# Patient Record
Sex: Female | Born: 1975 | Race: White | Hispanic: No | Marital: Married | Smoking: Never smoker
Health system: Southern US, Community
[De-identification: ages and names within clinical notes are randomized; demographics above are authoritative.]

## PROBLEM LIST (undated history)

## (undated) DIAGNOSIS — F32A Depression, unspecified: Secondary | ICD-10-CM

## (undated) DIAGNOSIS — F329 Major depressive disorder, single episode, unspecified: Secondary | ICD-10-CM

## (undated) DIAGNOSIS — E079 Disorder of thyroid, unspecified: Secondary | ICD-10-CM

## (undated) DIAGNOSIS — N809 Endometriosis, unspecified: Secondary | ICD-10-CM

## (undated) HISTORY — PX: LAPAROSCOPY: SHX197

## (undated) HISTORY — DX: Major depressive disorder, single episode, unspecified: F32.9

## (undated) HISTORY — PX: TOTAL ABDOMINAL HYSTERECTOMY: SHX209

## (undated) HISTORY — PX: GALLBLADDER SURGERY: SHX652

## (undated) HISTORY — DX: Depression, unspecified: F32.A

## (undated) HISTORY — DX: Endometriosis, unspecified: N80.9

## (undated) HISTORY — DX: Disorder of thyroid, unspecified: E07.9

## (undated) HISTORY — PX: ABDOMINAL HYSTERECTOMY: SHX81

---

## 2006-10-11 ENCOUNTER — Emergency Department: Payer: Self-pay | Admitting: Unknown Physician Specialty

## 2007-01-31 ENCOUNTER — Ambulatory Visit: Payer: Self-pay | Admitting: Obstetrics and Gynecology

## 2007-02-03 ENCOUNTER — Ambulatory Visit: Payer: Self-pay | Admitting: Obstetrics and Gynecology

## 2009-06-30 ENCOUNTER — Ambulatory Visit: Payer: Self-pay | Admitting: Obstetrics & Gynecology

## 2013-06-18 ENCOUNTER — Encounter: Payer: Self-pay | Admitting: Maternal & Fetal Medicine

## 2013-06-25 ENCOUNTER — Ambulatory Visit: Payer: Self-pay | Admitting: Obstetrics and Gynecology

## 2013-06-25 LAB — COMPREHENSIVE METABOLIC PANEL
AST: 28 U/L (ref 15–37)
Albumin: 3.5 g/dL (ref 3.4–5.0)
Alkaline Phosphatase: 43 U/L — ABNORMAL LOW
Anion Gap: 5 — ABNORMAL LOW (ref 7–16)
BUN: 10 mg/dL (ref 7–18)
Bilirubin,Total: 0.2 mg/dL (ref 0.2–1.0)
Calcium, Total: 8.2 mg/dL — ABNORMAL LOW (ref 8.5–10.1)
Chloride: 109 mmol/L — ABNORMAL HIGH (ref 98–107)
Co2: 27 mmol/L (ref 21–32)
Creatinine: 0.63 mg/dL (ref 0.60–1.30)
EGFR (African American): >60
EGFR (Non-African Amer.): >60
GLUCOSE: 127 mg/dL — AB (ref 65–99)
OSMOLALITY: 282 (ref 275–301)
Potassium: 3.7 mmol/L (ref 3.5–5.1)
SGPT (ALT): 16 U/L (ref 12–78)
Sodium: 141 mmol/L (ref 136–145)
Total Protein: 7 g/dL (ref 6.4–8.2)

## 2013-06-25 LAB — URINALYSIS, COMPLETE
BACTERIA: NONE SEEN
Bilirubin,UR: NEGATIVE
GLUCOSE, UR: NEGATIVE mg/dL (ref 0–75)
Ketone: NEGATIVE
NITRITE: NEGATIVE
Ph: 7 (ref 4.5–8.0)
Protein: NEGATIVE
RBC,UR: 1484 /HPF (ref 0–5)
Specific Gravity: 1.013 (ref 1.003–1.030)
WBC UR: 4 /HPF (ref 0–5)

## 2013-06-25 LAB — CBC
HCT: 39.8 % (ref 35.0–47.0)
HGB: 13.2 g/dL (ref 12.0–16.0)
MCH: 31.6 pg (ref 26.0–34.0)
MCHC: 33.2 g/dL (ref 32.0–36.0)
MCV: 95 fL (ref 80–100)
PLATELETS: 184 10*3/uL (ref 150–440)
RBC: 4.18 10*6/uL (ref 3.80–5.20)
RDW: 12.6 % (ref 11.5–14.5)
WBC: 7.8 10*3/uL (ref 3.6–11.0)

## 2013-06-25 LAB — LIPASE, BLOOD: Lipase: 139 U/L (ref 73–393)

## 2013-06-25 LAB — HCG, QUANTITATIVE, PREGNANCY: BETA HCG, QUANT.: 16016 m[IU]/mL — AB

## 2013-06-27 LAB — PATHOLOGY REPORT

## 2013-08-06 LAB — CBC WITH DIFFERENTIAL/PLATELET
BASOS ABS: 0.1 10*3/uL (ref 0.0–0.1)
Basophil %: 0.4 %
EOS ABS: 0 10*3/uL (ref 0.0–0.7)
Eosinophil %: 0.1 %
HCT: 42.5 % (ref 35.0–47.0)
HGB: 13.7 g/dL (ref 12.0–16.0)
LYMPHS ABS: 1 10*3/uL (ref 1.0–3.6)
Lymphocyte %: 6.3 %
MCH: 30.2 pg (ref 26.0–34.0)
MCHC: 32.3 g/dL (ref 32.0–36.0)
MCV: 94 fL (ref 80–100)
MONO ABS: 0.5 x10 3/mm (ref 0.2–0.9)
MONOS PCT: 3.4 %
Neutrophil #: 13.9 10*3/uL — ABNORMAL HIGH (ref 1.4–6.5)
Neutrophil %: 89.8 %
PLATELETS: 260 10*3/uL (ref 150–440)
RBC: 4.54 10*6/uL (ref 3.80–5.20)
RDW: 12.8 % (ref 11.5–14.5)
WBC: 15.5 10*3/uL — AB (ref 3.6–11.0)

## 2013-08-06 LAB — COMPREHENSIVE METABOLIC PANEL
ALBUMIN: 4.3 g/dL (ref 3.4–5.0)
ALK PHOS: 60 U/L
Anion Gap: 6 — ABNORMAL LOW (ref 7–16)
BUN: 11 mg/dL (ref 7–18)
Bilirubin,Total: 0.7 mg/dL (ref 0.2–1.0)
CALCIUM: 9.4 mg/dL (ref 8.5–10.1)
Chloride: 104 mmol/L (ref 98–107)
Co2: 26 mmol/L (ref 21–32)
Creatinine: 0.66 mg/dL (ref 0.60–1.30)
EGFR (African American): >60
Glucose: 106 mg/dL — ABNORMAL HIGH (ref 65–99)
OSMOLALITY: 272 (ref 275–301)
Potassium: 4.5 mmol/L (ref 3.5–5.1)
SGOT(AST): 41 U/L — ABNORMAL HIGH (ref 15–37)
SGPT (ALT): 19 U/L (ref 12–78)
SODIUM: 136 mmol/L (ref 136–145)
Total Protein: 8.4 g/dL — ABNORMAL HIGH (ref 6.4–8.2)

## 2013-08-06 LAB — URINALYSIS, COMPLETE
BILIRUBIN, UR: NEGATIVE
BLOOD: NEGATIVE
Bacteria: NONE SEEN
Glucose,UR: NEGATIVE mg/dL (ref 0–75)
Nitrite: NEGATIVE
PH: 6 (ref 4.5–8.0)
Protein: NEGATIVE
Specific Gravity: 1.027 (ref 1.003–1.030)
Squamous Epithelial: 3
WBC UR: 5 /HPF (ref 0–5)

## 2013-08-06 LAB — LIPASE, BLOOD: LIPASE: 146 U/L (ref 73–393)

## 2013-08-07 ENCOUNTER — Inpatient Hospital Stay: Payer: Self-pay | Admitting: Surgery

## 2013-08-07 LAB — COMPREHENSIVE METABOLIC PANEL
ALK PHOS: 52 U/L
Albumin: 3.5 g/dL (ref 3.4–5.0)
Anion Gap: 3 — ABNORMAL LOW (ref 7–16)
BUN: 8 mg/dL (ref 7–18)
Bilirubin,Total: 0.8 mg/dL (ref 0.2–1.0)
Calcium, Total: 8.9 mg/dL (ref 8.5–10.1)
Chloride: 105 mmol/L (ref 98–107)
Co2: 29 mmol/L (ref 21–32)
Creatinine: 0.8 mg/dL (ref 0.60–1.30)
EGFR (African American): >60
GLUCOSE: 94 mg/dL (ref 65–99)
OSMOLALITY: 272 (ref 275–301)
Potassium: 4 mmol/L (ref 3.5–5.1)
SGOT(AST): 24 U/L (ref 15–37)
SGPT (ALT): 15 U/L (ref 12–78)
SODIUM: 137 mmol/L (ref 136–145)
Total Protein: 7.2 g/dL (ref 6.4–8.2)

## 2013-08-09 LAB — PATHOLOGY REPORT

## 2013-12-31 ENCOUNTER — Ambulatory Visit: Payer: BC Managed Care – PPO | Admitting: Sports Medicine

## 2013-12-31 ENCOUNTER — Encounter: Payer: Self-pay | Admitting: Sports Medicine

## 2013-12-31 ENCOUNTER — Ambulatory Visit (INDEPENDENT_AMBULATORY_CARE_PROVIDER_SITE_OTHER): Payer: BC Managed Care – PPO | Admitting: Sports Medicine

## 2013-12-31 VITALS — BP 121/52 | Ht 66.5 in | Wt 162.0 lb

## 2013-12-31 DIAGNOSIS — M2241 Chondromalacia patellae, right knee: Secondary | ICD-10-CM | POA: Diagnosis not present

## 2013-12-31 NOTE — Progress Notes (Addendum)
   Subjective:    Patient ID: Emily Sawyer, female    DOB: 22-Apr-1975, 38 y.o.   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 Pain began acutely when she was reclining in a chair. While reclining she felt a pop in her knee and since then has had discomfort particularly with leg extensions or squatting. Her pain is in the anterior lateral knee. No swelling. No mechanical symptoms. She was seen at the [facility] in [place] and x-rays were obtained. They are unavailable for my review but she was told that she has a bone spur. Referral was made to our office for further evaluation and treatment. Patient denies pain with running. She has a 13 mile obstacle course race in 2 weeks and is in the process of training for a full marathon in 16 weeks. She denies any significant problems with her knees in the past but did have some knee pain when playing basketball in high school.no prior knee surgery. No hip pain.  Past medical history reviewed Medications reviewed Allergies reviewed Social history reviewed    Review of Systems     Objective:   Physical Exam Well-developed, well-nourished. No acute distress. Awake alert and oriented 3. Vital signs reviewed.  Right knee: Full range of motion. No effusion. No soft tissue swelling. There is quivering of the quadriceps when lowering the right lower leg against gravity. VMO weakness. Pretty good hip abductor strength. 2-3+ patellofemoral crepitus. There is tethering of the patella laterally but a negative patellar compression test. No tenderness to palpation along the course of the patellar tendon. No tenderness along the distal IT band. No joint line tenderness. Negative McMurray's. Good joint stability. Neurovascularly intact distally. Slight pes planus with standing.  Patient has some mild dynamic genu valgus with running and slight pronation. Running  without a limp.       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 She is okay to continue running but she will avoid those activities that cause her pain especially squats, lunges, or leg press. Follow-up in 4 weeks. She will bring her x-rays with her to her follow-up visit. Although she does have some mild pronation with running I'm going to hold on any sort of foot orthotic at this time. Call with questions or concerns prior to her follow-up visit.

## 2014-01-28 ENCOUNTER — Encounter: Payer: Self-pay | Admitting: Sports Medicine

## 2014-01-28 ENCOUNTER — Ambulatory Visit (INDEPENDENT_AMBULATORY_CARE_PROVIDER_SITE_OTHER): Payer: BC Managed Care – PPO | Admitting: Sports Medicine

## 2014-01-28 VITALS — BP 110/67 | HR 78 | Ht 66.0 in | Wt 162.0 lb

## 2014-01-28 DIAGNOSIS — M2241 Chondromalacia patellae, right knee: Secondary | ICD-10-CM

## 2014-01-28 DIAGNOSIS — M25561 Pain in right knee: Secondary | ICD-10-CM | POA: Diagnosis not present

## 2014-01-28 NOTE — Progress Notes (Signed)
   Subjective:    Patient ID: Emily Sawyer, female    DOB: [birth year], 38 y.o.   MRN: [id number]  HPI   Patient comes in today for follow-up on right knee pain. Overall, she is doing very well. She has not had any other episodes of popping or catching in her right knee. She does still experience some "cracking" with squats and lunges although she admits that she is only doing 40 squats as I instructed her during her last visit. She has been very compliant with her hip and quad strengthening program. As a result she feels like her hips and legs are much more stable. She has been able to continue with all activity including running. Only activities that she is currently avoiding are leg extensions and deep squats/lunges.    Review of Systems     Objective:   Physical Exam Well-developed, well-nourished. No acute distress.  Right knee: Full range of motion. No effusion. 2-3+ patellofemoral crepitus. There is much less square bring of the quadriceps muscle when lowering the right leg against gravity then was seen on her previous visit. No pain with patellar compression. No joint line tenderness. Negative McMurray's. Negative Thessaly's.  Evaluation of her running form shows resolution of her previously noted genu valgus. She still has a slightly pronated gait. Running without a limp.       Assessment & Plan:  Improving right knee pain secondary to chondromalacia patella  Patient is congratulated on being compliant with her hip and quad strengthening exercises. I think they're making a tremendous difference in her symptoms. Her running form is certainly much better. I'm going to try her in a pair of green sports insoles and scaphoid pads. She certainly doesn't have pronounced pronation with running but if she finds the temporary inserts to be comfortable then I would be happy to make her a pair of custom orthotics a later date. I think she can continue to increase activity as tolerated but I did  caution her about deep squats/lunges and leg extensions.

## 2014-05-16 IMAGING — US US OB < 14 WEEKS - US OB TV
1 series · 13 of 28 positions shown · non-contrast
Comparison: 06/18/2013

CLINICAL DATA: Increased bleeding and cramping, pain

EXAM:
OBSTETRIC <14 WK US AND TRANSVAGINAL OB US
TECHNIQUE: Both transabdominal and transvaginal ultrasound examinations were
performed for complete evaluation of the gestation as well as the
maternal uterus, adnexal regions, and pelvic cul-de-sac.
Transvaginal technique was performed to assess early pregnancy.

[Series 1: us ob < 14 weeks - us ob tv · 0.22mm/px · 13 of 175 slices shown]
[im 7/175]
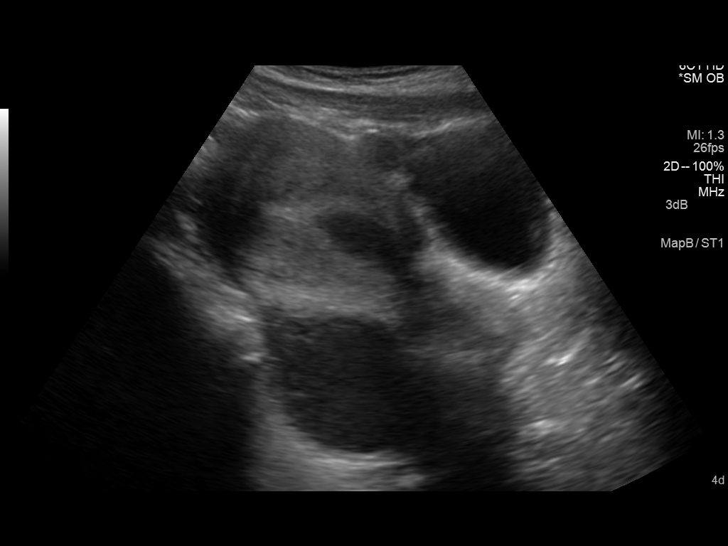
[im 20/175]
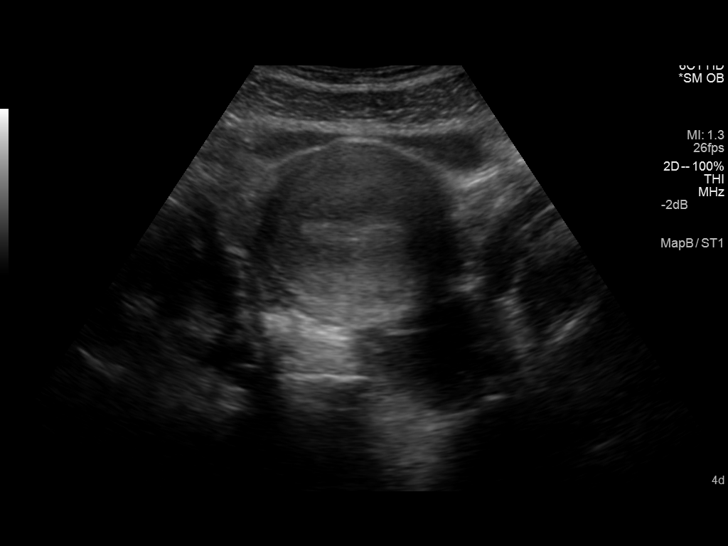
[im 33/175]
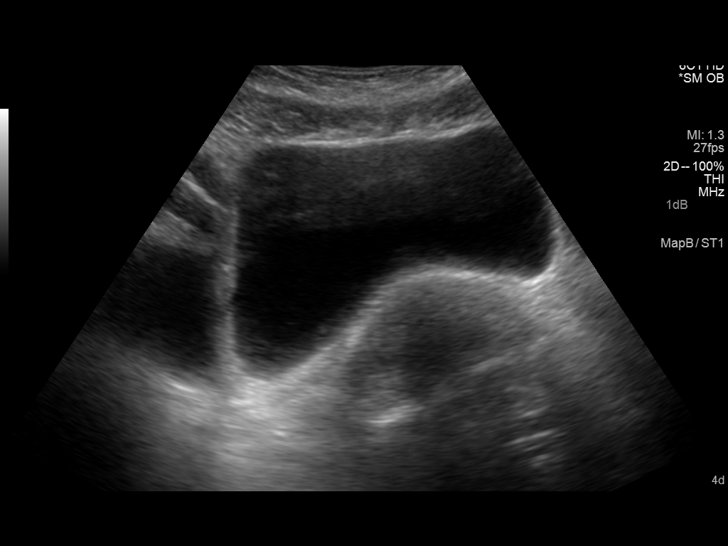
[im 46/175]
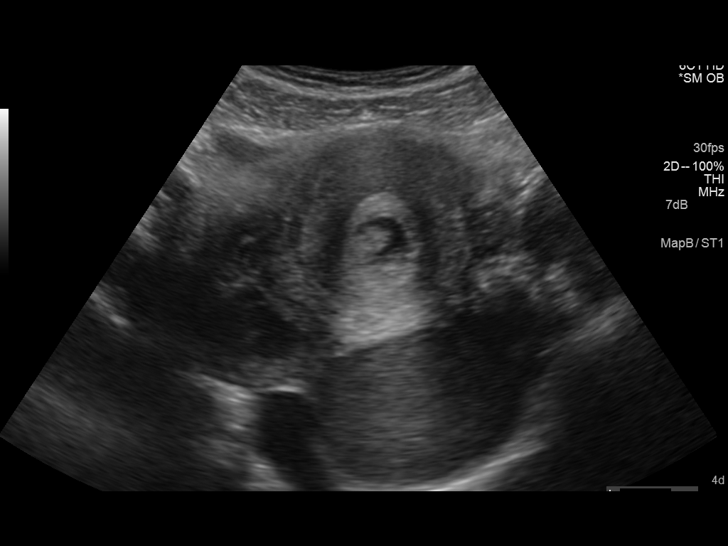
[im 59/175]
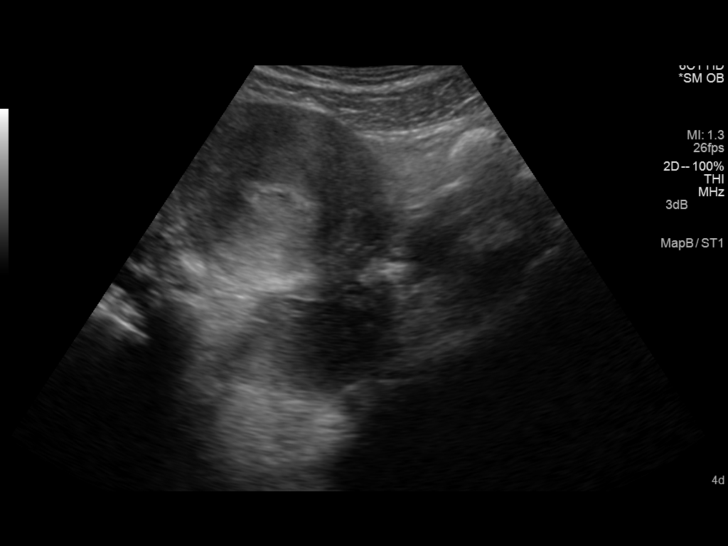
[im 71/175]
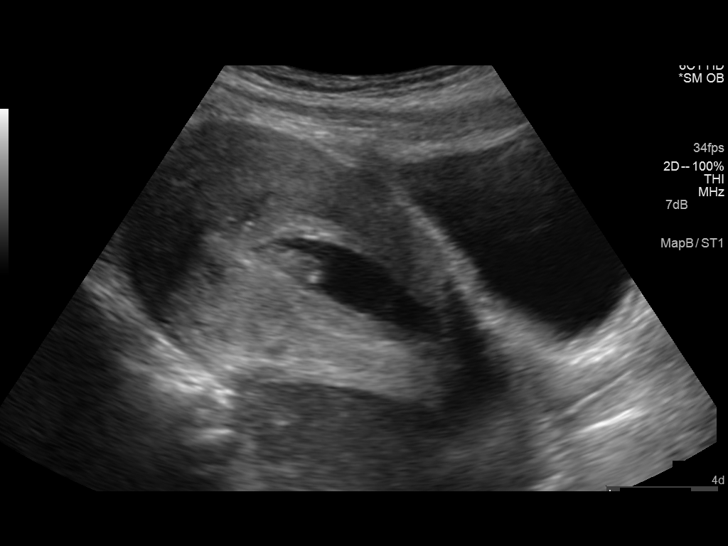
[im 91/175]
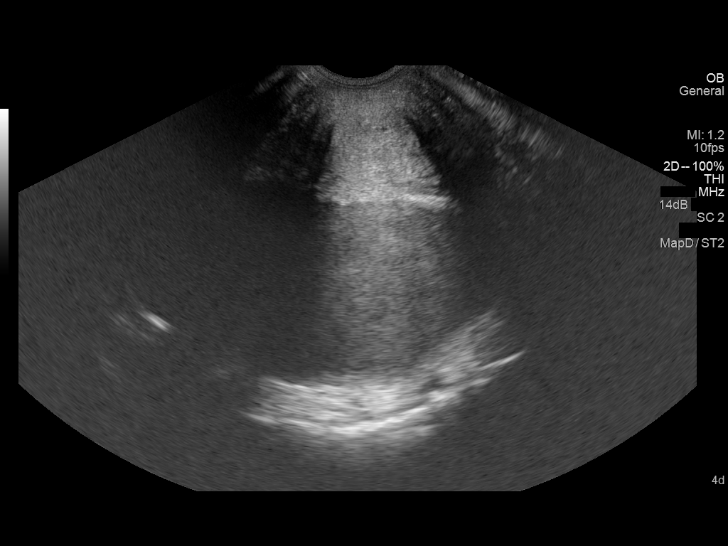
[im 104/175]
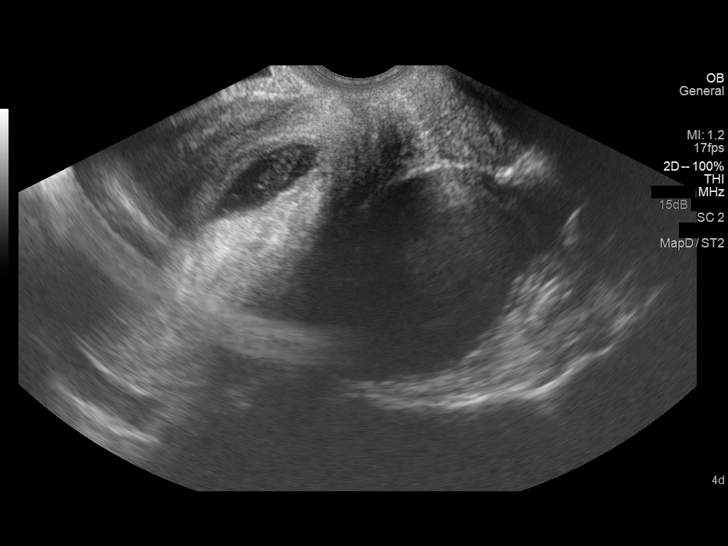
[im 117/175]
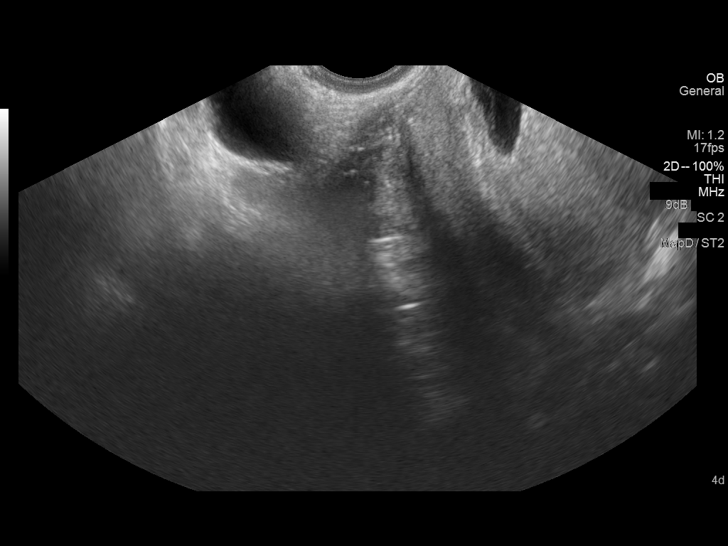
[im 129/175]
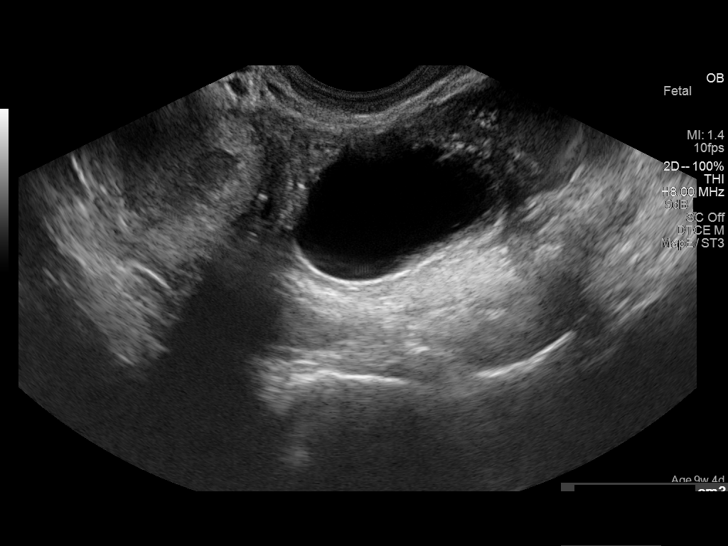
[im 142/175]
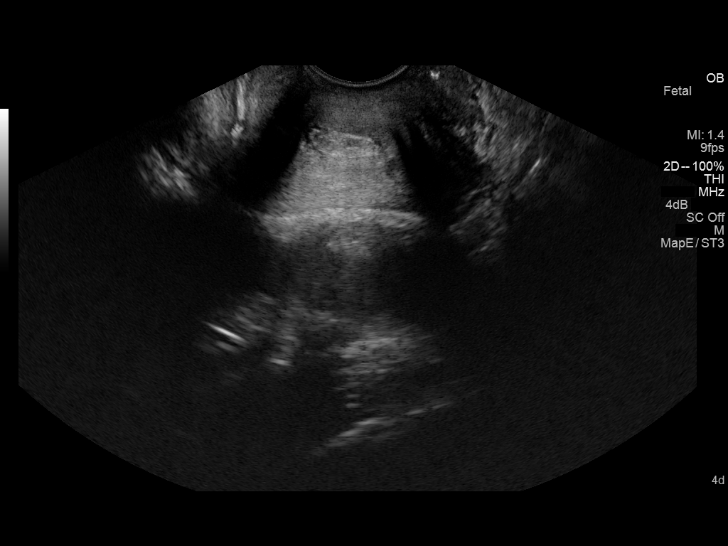
[im 155/175]
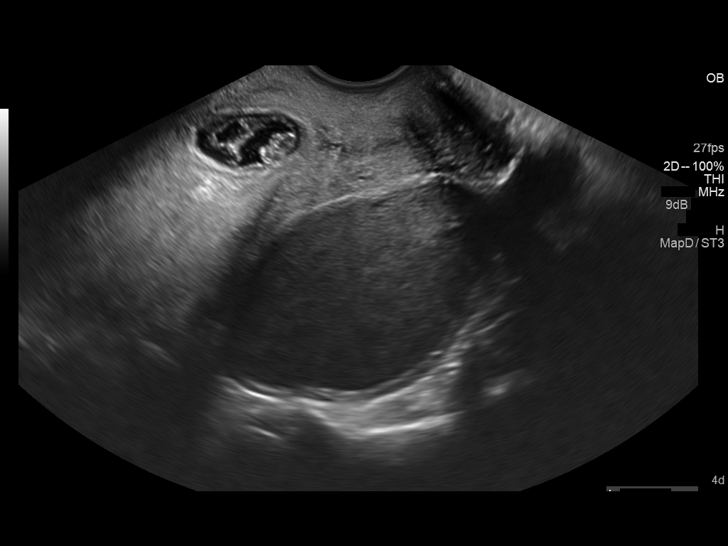
[im 168/175]
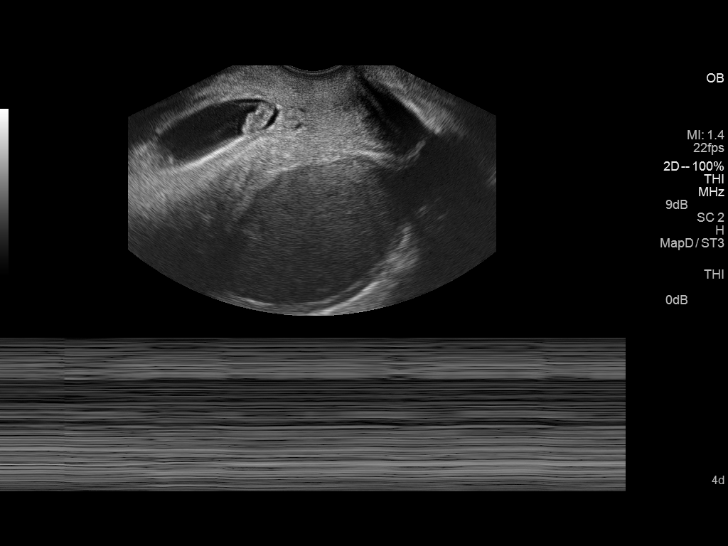

[13 of 28 positions shown; findings below may reference images not displayed]

FINDINGS: Intrauterine gestational sac: Visualized the elongated and slightly
irregular versus prior exam

Yolk sac:  Not identified though was present on prior exam

Embryo:  Present

Cardiac Activity: Not identified

Heart Rate:  N/A bpm

CRL:   11.3  mm   7 w 2d

Maternal uterus/adnexae:

Gestational sac appears elongated and irregular versus the previous
exam and now containing debris and scattered echogenic material.
Yolk sac is no longer identified and fetal pole is less well
defined. No fetal cardiac activity. Findings are compatible with
fetal demise.

RIGHT ovary measures 3.7 x 2.5 x 3.2 cm and contains a potentially
hemorrhagic cyst 2.1 x 2.2 x 2.0 cm as well as an exophytic simple
appearing cyst 3.9 x 2.3 x 3.1 cm. LEFT ovary enlarged 7.1 x 3.9 x
6.6 cm in size containing a hypoechoic mass 6.4 x 3.5 x 6.6 cm
slightly increased in size since the previous study when it measured
5.8 x 5.8 x 3.9 cm, question hemorrhagic cyst or endometrioma. No
blood flow seen within this lesion on color Doppler imaging. Blood
flow is seen within both ovaries on color Doppler imaging. Trace
free pelvic fluid.
IMPRESSION: Gestational sac within uterus though this echoes non elongated and
contains debris as well as a fetal pole is less well visualized on
the previous exam.

Yolk sac and cardiac activity are not identified.

Findings are compatible with fetal demise.

Complex lesions in both ovaries potentially hemorrhagic cyst though
cannot exclude endometrioma of the LEFT ovary.

Followup sonography of the ovaries recommended in 6-12 weeks during
the week following menses to re-evaluate these complicated lesions
and exclude tumor.

## 2014-06-22 NOTE — Op Note (Signed)
PATIENT NAME:  [name], Emily Sawyer MR#:  [id number] DATE OF BIRTH:  04-24-1975  DATE OF PROCEDURE:  08/07/2013  PREOPERATIVE DIAGNOSIS: Acute calculous cholecystitis and history of biliary colic.   POSTOPERATIVE DIAGNOSIS: Acute cholecystitis.   PROCEDURE PERFORMED: Laparoscopic cholecystectomy.   SURGEON: Demaya Hardge A. Marina Gravel, MD  ASSISTANTS: None.  ANESTHESIA: General endotracheal.   FINDINGS: Acute cholecystitis.   SPECIMENS: Gallbladder with contents.   ESTIMATED BLOOD LOSS: 50 mL.   DRAINS: None.   LAP AND NEEDLE COUNT: Correct x2.   DESCRIPTION OF PROCEDURE: With informed consent, supine position and general endotracheal anesthesia, the patient's abdomen was widely prepped and draped with ChloraPrep solution. Timeout was observed. A 12 mm blunt Hasson trocar was placed under direct visualization. Pneumoperitoneum was established. The gallbladder was acutely distended and edematous wall. Trocars were then placed, a 5 mm in the epigastric, two 5 mm ports in the right subcostal margin. The gallbladder was aspirated of approximately 100 mL of thick bile. It was grasped along its fundus, demonstrating a thickened wall, and elevated towards the right shoulder. An inflammatory rind was then dissected off the hepatoduodenal ligament, exposing a cystic duct and cystic artery, which were critically identified. The cystic duct was triply clipped on the portal side, singly clipped on the gallbladder side and divided. The cystic artery was likewise doubly clipped on the portal side, singly clipped on the gallbladder side and divided. The gallbladder was then retrieved off the gallbladder fossa utilizing hook cautery apparatus and placed into an Endo Catch device. Hemostasis in the gallbladder fossa was achieved. In order to extract the gallbladder, the fascial incision was widened. Pneumoperitoneum was then re-established. The specimen was handed off the field.   Ports were then removed under direct  visualization. A total of 30 mL of 0.25% plain Marcaine was infiltrated along all skin incisions and fascial incisions prior to closure. The infraumbilical fascial defect was closed with multiple simple and figure-of-eight 0 Vicryl sutures in vertical orientation. Skin incisions at the umbilicus was closed utilizing simple and vertical mattress 4-0 nylon sutures. The remaining skin incisions were closed with 4-0 Monocryl. Steri-Strips, Telfa and Tegaderm were then applied to the skin edges and the wounds, and the patient was subsequently extubated and taken to the recovery room in stable and satisfactory condition by anesthesia services.   ____________________________ Jeannette How Marina Gravel, MD mab:lb D: 08/08/2013 09:06:51 ET T: 08/08/2013 09:39:45 ET JOB#: [id number]  cc: Elta Guadeloupe A. Marina Gravel, MD, <Dictator> Stoney Bang. Georgianne Fick, MD Hortencia Conradi MD ELECTRONICALLY SIGNED 08/12/2013 11:44

## 2014-06-22 NOTE — H&P (Signed)
PATIENT NAME:  Emily Sawyer, Emily Sawyer MR#:  [id number] DATE OF BIRTH:  11/11/1975  DATE OF ADMISSION:  08/07/2013  CHIEF COMPLAINT: Epigastric pain.   HISTORY OF PRESENT ILLNESS: This is a patient with recurrent episodic right upper quadrant and epigastric pain radiating through to her back. She has had multiple episodes, but this one started this morning at 9:00, and has been unrelenting. Her other episodes have lasted 4 to 6 hours and resolved spontaneously. She came to the Emergency Room because she had vomited multiple times, and remains nauseated at this point. A work-up has shown probable acute cholecystitis, and I was asked to see the patient for admission. She denies jaundice or acholic stools. Denies fevers or chills. She does have multiple family members who have had similar attacks and required cholecystectomy.   PAST MEDICAL HISTORY: None.   PAST SURGICAL HISTORY: Laparoscopy for ovarian cyst and recent D and C for  miscarriage.   ALLERGIES: NONE, ALTHOUGH SHE THINKS SHE HAS HAD NAUSEA WITH CODEINE.   MEDICATIONS: Prenatal vitamins.   FAMILY HISTORY: Gallbladder disease in multiple family members.   SOCIAL HISTORY: The patient does not smoke or drink. She is a Pharmacist, hospital.   REVIEW OF SYSTEMS: A 10 system review is performed and negative with the exception of that mentioned in the history of present illness.   PHYSICAL EXAMINATION: GENERAL: A healthy female patient in no acute distress. She appears comfortable.  VITAL SIGNS: Temperature of 98.3, pulse 56, respirations 20, blood pressure 118/50, 99% room air sats. Pain scale rated at 10.  HEENT: No scleral icterus.  NECK: No palpable neck nodes.  CHEST: Clear to auscultation.  CARDIAC: Regular rate and rhythm.  ABDOMEN: Soft. There is tenderness in the epigastrium and right upper quadrant, with a positive Murphy's sign.  EXTREMITIES: Without edema.  NEUROLOGIC: Grossly intact.  INTEGUMENT: No jaundice.   DIAGNOSTIC DATA: Electrolytes are  within normal limits. AST is 41, ALT of 19, alkaline phosphatase is 60. Lipase is 146. White blood cell count is 15.5, hemoglobin and hematocrit of 13.7 and 42, and a platelet count of 260. An ultrasound shows multiple gallstones as well as a thickened gallbladder wall. Negative sonographic Murphy's sign, however.   ASSESSMENT AND PLAN: This is a patient with classic symptoms of recurrent biliary colic, who now presents with signs of acute cholecystitis. She is tender in the right upper quadrant and, by my exam, has a positive Murphy's sign. I have recommended admission to the hospital because she is vomiting and would not be able to keep down pain medications, etc. I discussed with her the rationale for admission and the options of observation, and the plan for proceeding to laparoscopic cholecystectomy if she so chooses. The procedure was described for her. The risks of bleeding, infection, failure to resolve all of her symptoms, conversion to an open procedure, bile duct damage, bile duct leak, retained common bile duct stone or bowel injury were all discussed with her and her husband. They understood and agreed to proceed with this plan.    ____________________________ Jerrol Banana. Burt Knack, MD rec:cg D: 08/07/2013 00:11:33 ET T: 08/07/2013 01:38:12 ET JOB#: [id number]  cc: Jerrol Banana. Burt Knack, MD, <Dictator> Florene Glen MD ELECTRONICALLY SIGNED 08/07/2013 2:15

## 2014-06-22 NOTE — Op Note (Signed)
PATIENT NAME:  Emily Sawyer, Emily Sawyer MR#:  [id number] DATE OF BIRTH:  1975/05/15  DATE OF PROCEDURE:  06/25/2013  PREOPERATIVE DIAGNOSIS: Incomplete abortion.   POSTOPERATIVE DIAGNOSIS:  Incomplete abortion.  PROCEDURE PERFORMED: Suction curette D and C.   ESTIMATED BLOOD LOSS: 25 mL.   SURGEON:  Clayburn Pert, M.D.   FINDINGS:  A 10 cm sounding uterus with products of conception seen.   DESCRIPTION OF PROCEDURE: The patient was taken to the operating room and placed in supine position. After adequate general endotracheal anesthesia was instilled, the patient was prepped and draped in the usual sterile fashion. Timeout was performed. The patient was continued to be prepped and draped in the usual sterile fashion. Bladder was drained; 100 mL was obtained. The uterus was serially dilated to accommodate an 8 curved curette. Gentle curette was performed. Suction was performed. Products of conception were identified. Gentle curette was performed after Methergine 0.2 mg was given IM and the single-tooth tenaculum was removed from the cervix. The uterus was curetted 1 time with a curette to be sure all products were gone. Gritty feeling was identified. The patient was then taken to the recovery room after having tolerated the procedure well.     ____________________________ Delsa Sale, MD cck:dmm D: 06/25/2013 17:10:17 ET T: 06/25/2013 21:26:59 ET JOB#: [id number]  cc: Delsa Sale, MD, <Dictator> Delsa Sale MD ELECTRONICALLY SIGNED 06/25/2013 21:46

## 2014-06-22 NOTE — H&P (Signed)
Subjective/Chief Complaint epig pain   History of Present Illness 12 hrs unrelenting abd pain, mult prior episodes n/v, no f/c no jaundice   Past History PMH none PSH lap for ov cyst, recent D&C   Past Med/Surgical Hx:  denies med hx:   D&C:   ALLERGIES:  Codeine: GI Distress  Sulfa drugs: Unknown  Pollen: Unknown  Family and Social History:  Family History Non-Contributory  mult members with GB disease   Social History negative tobacco, negative ETOH, teacher   Review of Systems:  Fever/Chills No   Cough No   Abdominal Pain Yes   Diarrhea No   Constipation No   Nausea/Vomiting Yes   SOB/DOE No   Chest Pain No   Dysuria No   Tolerating Diet No  Nauseated  Vomiting   Medications/Allergies Reviewed Medications/Allergies reviewed   Physical Exam:  GEN no acute distress   HEENT pink conjunctivae   NECK supple   RESP normal resp effort  clear BS  no use of accessory muscles   CARD regular rate   ABD positive tenderness  no hernia  pos murphy's sign   LYMPH negative neck   EXTR negative edema   SKIN normal to palpation   PSYCH alert, A+O to time, place, person, good insight   Lab Results: Hepatic:  08-Jun-15 21:18   Bilirubin, Total 0.7  Alkaline Phosphatase 60 (45-117 NOTE: New Reference Range 01/19/13)  SGPT (ALT) 19  SGOT (AST)  41  Total Protein, Serum  8.4  Albumin, Serum 4.3  Routine Chem:  08-Jun-15 21:18   Glucose, Serum  106  BUN 11  Creatinine (comp) 0.66  Sodium, Serum 136  Potassium, Serum 4.5  Chloride, Serum 104  CO2, Serum 26  Calcium (Total), Serum 9.4  Osmolality (calc) 272  eGFR (African American) >60  eGFR (Non-African American) >60 (eGFR values <68m/min/1.73 m2 may be an indication of chronic kidney disease (CKD). Calculated eGFR is useful in patients with stable renal function. The eGFR calculation will not be reliable in acutely ill patients when serum creatinine is changing rapidly. It is not useful  in  patients on dialysis. The eGFR calculation may not be applicable to patients at the low and high extremes of body sizes, pregnant women, and vegetarians.)  Result Comment AST/POTASSIUM - Slight hemolysis, interpret results with  - caution.  Result(s) reported on 06 Aug 2013 at 09:44PM.  Anion Gap  6  Lipase 146 (Result(s) reported on 06 Aug 2013 at 09:53PM.)  Routine UA:  08-Jun-15 21:18   Color (UA) Yellow  Clarity (UA) Clear  Glucose (UA) Negative  Bilirubin (UA) Negative  Ketones (UA) 1+  Specific Gravity (UA) 1.027  Blood (UA) Negative  pH (UA) 6.0  Protein (UA) Negative  Nitrite (UA) Negative  Leukocyte Esterase (UA) Trace (Result(s) reported on 06 Aug 2013 at 09:41PM.)  RBC (UA) 4 /HPF  WBC (UA) 5 /HPF  Bacteria (UA) NONE SEEN  Epithelial Cells (UA) 3 /HPF  Mucous (UA) PRESENT (Result(s) reported on 06 Aug 2013 at 09:41PM.)  Routine Hem:  08-Jun-15 21:18   WBC (CBC)  15.5  RBC (CBC) 4.54  Hemoglobin (CBC) 13.7  Hematocrit (CBC) 42.5  Platelet Count (CBC) 260  MCV 94  MCH 30.2  MCHC 32.3  RDW 12.8  Neutrophil % 89.8  Lymphocyte % 6.3  Monocyte % 3.4  Eosinophil % 0.1  Basophil % 0.4  Neutrophil #  13.9  Lymphocyte # 1.0  Monocyte # 0.5  Eosinophil # 0.0  Basophil # 0.1 (Result(s) reported on 06 Aug 2013 at 09:44PM.)   Radiology Results: [place]:    08-Jun-15 22:40, US Abdomen Limited Survey  US Abdomen Limited Survey  REASON FOR EXAM:    RUQ pain  COMMENTS:   May transport without cardiac monitor    PROCEDURE: [place]  - US ABDOMEN LIMITED SURVEY  - Aug 06 2013 10:40PM     CLINICAL DATA:  Right upper quadrant pain.    EXAM:  US ABDOMEN LIMITED - RIGHT UPPER QUADRANT    COMPARISON:  CT abdomen and pelvis 10/11/2006.    FINDINGS:  Gallbladder:  Multiple gallstones are present with sludge. The largest stone  measures 8 mm. Gallbladder wall is mildly thickened and edematous  measuring up to 4.4 mm. Negative sonographicMurphy's sign.    Common bile  duct:    Diameter: Within normal limits measuring 3.0 mm.    Liver:    Normal in size and echotexture. Focal area at the porta hepatis  level, possible hemangioma, with cross-sectional measurements of 2.7  x 2.0 x 2.3 cm. This area was present on CT previously.     IMPRESSION:  Cholelithiasis. Edematous gallbladder wall without positive  sonographic Murphy's sign. Correlate clinically for acute  cholecystitis.      Electronically Signed    By: Rolla Flatten M.D.    On: 08/06/2013 23:02         Verified By: Staci Righter, M.D.,    Assessment/Admission Diagnosis acute cholecystitis rec admit hydrate lap chole Dr Marina Gravel to perform in am risks and options rev'd agrees with [plan   Electronic Signatures: Florene Glen (MD)  (Signed 09-Jun-15 00:00)  Authored: CHIEF COMPLAINT and HISTORY, PAST MEDICAL/SURGIAL HISTORY, ALLERGIES, FAMILY AND SOCIAL HISTORY, REVIEW OF SYSTEMS, PHYSICAL EXAM, LABS, Radiology, ASSESSMENT AND PLAN   Last Updated: 09-Jun-15 00:00 by Florene Glen (MD)

## 2014-07-09 NOTE — H&P (Signed)
L&D Evaluation:  History Expanded:  HPI G1po with incomplete ab whio has started bleeeidng today and desires d and c   Gravida 1   Term 0   PreTerm 0   Abortion 0   Living 0   Blood Type (Maternal) A positive   Group B Strep Results Maternal (Result >5wks must be treated as unknown) unknown/result > 5 weeks ago   Maternal HIV Negative   Maternal Syphilis Ab Unknown   Maternal Varicella Unknown   Rubella Results (Maternal) unknown   Presents with vaginal bleeding   Patient's Medical History No Chronic Illness   Patient's Surgical History none   Medications Pre Natal Vitamins   Allergies NKDA   Social History none   Current Prenatal Course Notable For no fetal heart beat   ROS:  ROS All systems were reviewed.  HEENT, CNS, GI, GU, Respiratory, CV, Renal and Musculoskeletal systems were found to be normal.   Exam:  Vital Signs stable   General no apparent distress, disteressed   Mental Status clear   Chest clear   Heart normal sinus rhythm   Abdomen small   Back no CVAT   Reflexes 1+   Pelvic no external lesions   Mebranes Intact   FHT normal rate with no decels   Ucx absent   Skin dry   Lymph no lymphadenopathy   Impression:  Impression incopmplete ab   Plan:  Comments doxycycline 100 mg po bid x 7 days methergine 0.2 mg q5 hrs x 4 doses norco 5/325 1 po  q4h prn pain motrin 600 mg po q h prn cramping drink plenty of fluids   Follow Up Appointment in 2 weeks. my office [facility]   Electronic Signatures: Erik Obey (MD)  (Signed 27-Apr-15 16:11)  Authored: L&D Evaluation   Last Updated: 27-Apr-15 16:11 by Erik Obey (MD)

## 2014-11-26 ENCOUNTER — Encounter: Payer: Self-pay | Admitting: Emergency Medicine

## 2014-11-26 ENCOUNTER — Emergency Department
Admission: EM | Admit: 2014-11-26 | Discharge: 2014-11-26 | Disposition: A | Discharge status: Home / Self Care | Payer: BC Managed Care – PPO | Attending: Emergency Medicine | Admitting: Emergency Medicine

## 2014-11-26 ENCOUNTER — Emergency Department: Payer: BC Managed Care – PPO

## 2014-11-26 DIAGNOSIS — O209 Hemorrhage in early pregnancy, unspecified: Secondary | ICD-10-CM | POA: Insufficient documentation

## 2014-11-26 DIAGNOSIS — Z3A01 Less than 8 weeks gestation of pregnancy: Secondary | ICD-10-CM | POA: Diagnosis not present

## 2014-11-26 LAB — ABO/RH: ABO/RH(D): A POS

## 2014-11-26 LAB — CBC
HCT: 39.4 % (ref 35.0–47.0)
Hemoglobin: 12.8 g/dL (ref 12.0–16.0)
MCH: 30.4 pg (ref 26.0–34.0)
MCHC: 32.6 g/dL (ref 32.0–36.0)
MCV: 93 fL (ref 80.0–100.0)
Platelets: 220 10*3/uL (ref 150–440)
RBC: 4.23 MIL/uL (ref 3.80–5.20)
RDW: 12.9 % (ref 11.5–14.5)
WBC: 7.5 10*3/uL (ref 3.6–11.0)

## 2014-11-26 LAB — HCG, QUANTITATIVE, PREGNANCY: hCG, Beta Chain, Quant, S: 1968 m[IU]/mL — ABNORMAL HIGH (ref <5)

## 2014-11-26 MED ORDER — HYDROCODONE-ACETAMINOPHEN 5-325 MG PO TABS: 1.0000 | ORAL_TABLET | Freq: Four times a day (QID) | ORAL | Status: DC | PRN

## 2014-11-26 MED ORDER — HYDROCODONE-ACETAMINOPHEN 5-325 MG PO TABS
1.0000 | ORAL_TABLET | Freq: Once | ORAL | Status: AC
  Administered 2014-11-26: 1 via ORAL
  Filled 2014-11-26: qty 1

## 2014-11-26 NOTE — Discharge Instructions (Signed)
Please follow up with [facility] this week. Call today to make an appointment, please tell them that Dr. Ilda Basset has referred you to be seen this week.  Vaginal Bleeding During Pregnancy, First Trimester A small amount of bleeding (spotting) from the vagina is relatively common in early pregnancy. It usually stops on its own. Various things may cause bleeding or spotting in early pregnancy. Some bleeding may be related to the pregnancy, and some may not. In most cases, the bleeding is normal and is not a problem. However, bleeding can also be a sign of something serious. Be sure to tell your health care provider about any vaginal bleeding right away. Some possible causes of vaginal bleeding during the first trimester include:  Infection or inflammation of the cervix.  Growths (polyps) on the cervix.  Miscarriage or threatened miscarriage.  Pregnancy tissue has developed outside of the uterus and in a fallopian tube (tubal pregnancy).  Tiny cysts have developed in the uterus instead of pregnancy tissue (molar pregnancy). HOME CARE INSTRUCTIONS  Watch your condition for any changes. The following actions may help to lessen any discomfort you are feeling:  Follow your health care provider's instructions for limiting your activity. If your health care provider orders bed rest, you may need to stay in bed and only get up to use the bathroom. However, your health care provider may allow you to continue light activity.  If needed, make plans for someone to help with your regular activities and responsibilities while you are on bed rest.  Keep track of the number of pads you use each day, how often you change pads, and how soaked (saturated) they are. Write this down.  Do not use tampons. Do not douche.  Do not have sexual intercourse or orgasms until approved by your health care provider.  If you pass any tissue from your vagina, save the tissue so you can show it to your health care  provider.  Only take over-the-counter or prescription medicines as directed by your health care provider.  Do not take aspirin because it can make you bleed.  Keep all follow-up appointments as directed by your health care provider. SEEK MEDICAL CARE IF:  You have any vaginal bleeding during any part of your pregnancy.  You have cramps or labor pains.  You have a fever, not controlled by medicine. SEEK IMMEDIATE MEDICAL CARE IF:   You have severe cramps in your back or belly (abdomen).  You pass large clots or tissue from your vagina.  Your bleeding increases.  You feel light-headed or weak, or you have fainting episodes.  You have chills.  You are leaking fluid or have a gush of fluid from your vagina.  You pass out while having a bowel movement. MAKE SURE YOU:  Understand these instructions.  Will watch your condition.  Will get help right away if you are not doing well or get worse. Document Released: 11/25/2004 Document Revised: 02/20/2013 Document Reviewed: 10/23/2012 [facility] Patient Information 2015 [place], [place]. This information is not intended to replace advice given to you by your health care provider. Make sure you discuss any questions you have with your health care provider.

## 2014-11-26 NOTE — ED Provider Notes (Signed)
[facility] Emergency Department Provider Note REMINDER - THIS NOTE IS NOT A FINAL MEDICAL RECORD UNTIL IT IS SIGNED. UNTIL THEN, THE CONTENT BELOW MAY REFLECT INFORMATION FROM A DOCUMENTATION TEMPLATE, NOT THE ACTUAL PATIENT VISIT. ____________________________________________  Time seen: Approximately 8:53 AM  I have reviewed the triage vital signs and the nursing notes.   HISTORY  Chief Complaint Vaginal Bleeding    HPI Emily Sawyer is a 39 y.o. female the history of 2 previous miscarriages. She reports that about one week ago she was seen at [facility] doctor's office and had an ultrasound and they had questioned if they were able to actually see a heartbeat. She was to have a follow-up ultrasound this week, but she noticed on Friday that she started having some vaginal spotting, she's been having bleeding about one to 2 pads every few hours since Saturday, and she reports that she's been having a fair amount of cramping in the lower abdomen today. The patient and her husband are concerned that she is having another miscarriage.  No fevers or chills. No nausea or vomiting. No chest pain or trouble breathing. No other concerns or symptoms. She reports the bleeding as mild, but when she was standing occasionally she'll have a slight gush of blood,, roughly equivalent of 1-2 pads every few hours for the last day.   History reviewed. No pertinent past medical history.  There are no active problems to display for this patient.   History reviewed. No pertinent past surgical history.  No current outpatient prescriptions on file.  Allergies Codeine  History reviewed. No pertinent family history.  Social History Social History  Substance Use Topics  . Smoking status: Never Smoker   . Smokeless tobacco: None  . Alcohol Use: 0.0 oz/week    0 Standard drinks or equivalent per week    Review of Systems Constitutional: No fever/chills Eyes: No visual changes. ENT:  No sore throat. Cardiovascular: Denies chest pain. Respiratory: Denies shortness of breath. Gastrointestinal: No nausea, no vomiting.  No diarrhea.  No constipation. Genitourinary: Negative for dysuria. Musculoskeletal: Negative for back pain. Skin: Negative for rash. Neurological: Negative for headaches, focal weakness or numbness.  10-point ROS otherwise negative.  ____________________________________________   PHYSICAL EXAM:  VITAL SIGNS: ED Triage Vitals  Enc Vitals Group     BP 11/26/14 0753 116/64 mmHg     Pulse Rate 11/26/14 0753 64     Resp 11/26/14 0753 20     Temp 11/26/14 0753 98 F (36.7 C)     Temp Source 11/26/14 0753 Oral     SpO2 11/26/14 0753 98 %     Weight 11/26/14 0753 162 lb (73.483 kg)     Height 11/26/14 0753 5\' 6"  (1.676 m)     Head Cir --      Peak Flow --      Pain Score 11/26/14 0754 8     Pain Loc --      Pain Edu? --      Excl. in [place]? --    Constitutional: Alert and oriented. Well appearing and in no acute distress. Eyes: Conjunctivae are normal. PERRL. EOMI. Head: Atraumatic. Nose: No congestion/rhinnorhea. Mouth/Throat: Mucous membranes are moist.  Oropharynx non-erythematous. Neck: No stridor.   Cardiovascular: Normal rate, regular rhythm. Grossly normal heart sounds.  Good peripheral circulation. Respiratory: Normal respiratory effort.  No retractions. Lungs CTAB. Gastrointestinal: Soft and nontender except for in the suprapubic region where she has moderate tenderness to palpation without rebound or guarding.  No distention. No abdominal bruits. No CVA tenderness. The abdomen does not appear gravid by exam. Musculoskeletal: No lower extremity tenderness nor edema.  No joint effusions. Neurologic:  Normal speech and language. No gross focal neurologic deficits are appreciated. No gait instability. Skin:  Skin is warm, dry and intact. No rash noted. Psychiatric: Mood and affect are normal. Speech and behavior are  normal.  ____________________________________________   LABS (all labs ordered are listed, but only abnormal results are displayed)  Labs Reviewed  HCG, QUANTITATIVE, PREGNANCY  CBC  ABO/RH   ____________________________________________  EKG   ____________________________________________  RADIOLOGY   IMPRESSION: 1. Non-localization of the pregnancy on this ultrasound study. The sonographic differential diagnosis includes a spontaneous abortion or less likely an intrauterine gestation too early to visualize or an occult ectopic gestation. Close clinical follow-up with serial serum beta HCG monitoring is advised, with repeat pelvic ultrasound as clinically warranted. 2. Uterine fibroids as described. 3. Superimposed uterine adenomyosis, see comments. 4. Bilateral large similar-appearing hypoechoic ovarian masses without internal vascularity, suggestive of bilateral ovarian endometriomas, increased on the right and stable on the left compared to 06/25/13 pelvic sonogram. Continued pelvic sonographic follow-up is advised for these ovarian masses if not resected. ____________________________________________   PROCEDURES  Procedure(s) performed: None  Critical Care performed: No  ____________________________________________   INITIAL IMPRESSION / ASSESSMENT AND PLAN / ED COURSE  Pertinent labs & imaging results that were available during my care of the patient were reviewed by me and considered in my medical decision making (see chart for details).  Patient presents with lower abdominal discomfort, moderate vaginal bleeding and cramps. She is roughly 6-[redacted] weeks pregnant based on her last menstrual period. There was question on her previous ultrasound whether or not she had a viable pregnancy per her report. At this point, her primary concern would be to exclude ectopic pregnancy, and we will further evaluate with ultrasound and lab work. She is hemodynamically stable, and  I anticipate consultation by OB/GYN via the phone once results have returned.  Rh positive.  Patient does have significant episodes of crampy pain and request medication stronger than Tylenol, and this is reasonable and we discussed the risks and relative benefits of medication such as hydrocodone which I will give her tab of. She and husband are agreeable with this. She doesn't appear he is allergy to codeine only which causes significant nausea, but she states she has tolerated other medications which are stronger in the past without problem.   Discussed U/S and labs, clinical presentation with Dr. Sheryn Bison of OBGYN. On 9/21 patient had a yolk sac seen and feels this is most likely a misscarriage, but will need close follow-up for this pregnancy and ovarian masses.   Patient to call office today and will be able to be seen this week with [facility] per Dr. Ilda Basset.  Patient advised on care plan and close return precautions.  I will prescribe the patient a narcotic pain medicine due to their condition which I anticipate will cause at least moderate pain short term. I discussed with the patient safe use of narcotic pain medicines, and that they are not to drive, work in dangerous areas, or ever take more than prescribed (no more than 1 pill every 6 hours). We discussed that this is the type of medication that "Alfonse Spruce" may have overdosed on and the risks of this type of medicine. Patient is very agreeable to only use as prescribed and to never use more than prescribed.  ____________________________________________  FINAL CLINICAL IMPRESSION(S) / ED DIAGNOSES  Final diagnoses:  Vaginal bleeding before [redacted] weeks gestation      Delman Kitten, MD 11/26/14 1204

## 2014-11-26 NOTE — ED Notes (Signed)
States she is about 6 weeks preg  Developed abd cramping and vaginal bleeding ...had some some spotting on fri and sat

## 2014-12-10 ENCOUNTER — Ambulatory Visit
Admission: RE | Admit: 2014-12-10 | Discharge: 2014-12-10 | Disposition: A | Discharge status: Home / Self Care | Payer: BC Managed Care – PPO | Source: Ambulatory Visit | Attending: Obstetrics & Gynecology | Admitting: Obstetrics & Gynecology

## 2014-12-10 ENCOUNTER — Other Ambulatory Visit: Payer: Self-pay | Admitting: Obstetrics & Gynecology

## 2014-12-10 DIAGNOSIS — Z3A10 10 weeks gestation of pregnancy: Secondary | ICD-10-CM

## 2014-12-10 DIAGNOSIS — O209 Hemorrhage in early pregnancy, unspecified: Secondary | ICD-10-CM

## 2014-12-10 DIAGNOSIS — D259 Leiomyoma of uterus, unspecified: Secondary | ICD-10-CM | POA: Diagnosis not present

## 2014-12-17 ENCOUNTER — Encounter: Payer: Self-pay | Admitting: Family Medicine

## 2014-12-17 ENCOUNTER — Ambulatory Visit (INDEPENDENT_AMBULATORY_CARE_PROVIDER_SITE_OTHER): Payer: BC Managed Care – PPO | Admitting: Family Medicine

## 2014-12-17 VITALS — BP 120/80 | HR 64 | Ht 66.0 in | Wt 158.0 lb

## 2014-12-17 DIAGNOSIS — F322 Major depressive disorder, single episode, severe without psychotic features: Secondary | ICD-10-CM | POA: Diagnosis not present

## 2014-12-17 MED ORDER — SERTRALINE HCL 50 MG PO TABS: 50.0000 mg | ORAL_TABLET | Freq: Every day | ORAL | Status: DC

## 2014-12-17 NOTE — Progress Notes (Signed)
Name: Emily Sawyer   MRN: [id number]    DOB: Sep 14, 1975   Date:12/17/2014       Progress Note  Subjective  Chief Complaint  Chief Complaint  Patient presents with  . Depression    Sept 21 had a miscarriage    Depression        This is a new problem.  The current episode started more than 1 month ago.   The onset quality is gradual.   The problem occurs daily.  The problem has been gradually worsening since onset.  Associated symptoms include hopelessness and insomnia.  Associated symptoms include no appetite change, no myalgias, no headaches and no suicidal ideas.  Past treatments include nothing.  Past medical history includes anxiety.     No problem-specific assessment & plan notes found for this encounter.   No past medical history on file.  No past surgical history on file.  No family history on file.  Social History   Social History  . Marital Status: Married    Spouse Name: N/A  . Number of Children: N/A  . Years of Education: N/A   Occupational History  . Not on file.   Social History Main Topics  . Smoking status: Never Smoker   . Smokeless tobacco: Not on file  . Alcohol Use: 0.0 oz/week    0 Standard drinks or equivalent per week  . Drug Use: Not on file  . Sexual Activity: Not on file   Other Topics Concern  . Not on file   Social History Narrative    Allergies  Allergen Reactions  . Codeine      Review of Systems  Constitutional: Negative for fever, chills, weight loss, malaise/fatigue and appetite change.  HENT: Negative for ear discharge, ear pain and sore throat.   Eyes: Negative for blurred vision.  Respiratory: Negative for cough, sputum production, shortness of breath and wheezing.   Cardiovascular: Negative for chest pain, palpitations and leg swelling.  Gastrointestinal: Negative for heartburn, nausea, abdominal pain, diarrhea, constipation, blood in stool and melena.  Genitourinary: Negative for dysuria, urgency, frequency and  hematuria.  Musculoskeletal: Negative for myalgias, back pain, joint pain and neck pain.  Skin: Negative for rash.  Neurological: Negative for dizziness, tingling, sensory change, focal weakness and headaches.  Endo/Heme/Allergies: Negative for environmental allergies and polydipsia. Does not bruise/bleed easily.  Psychiatric/Behavioral: Positive for depression. Negative for suicidal ideas. The patient has insomnia. The patient is not nervous/anxious.      Objective  Filed Vitals:   12/17/14 0910  BP: 120/80  Pulse: 64  Height: 5\' 6"  (1.676 m)  Weight: 158 lb (71.668 kg)    Physical Exam  Constitutional: She is well-developed, well-nourished, and in no distress. No distress.  HENT:  Head: Normocephalic and atraumatic.  Right Ear: External ear normal.  Left Ear: External ear normal.  Nose: Nose normal.  Mouth/Throat: Oropharynx is clear and moist.  Eyes: Conjunctivae and EOM are normal. Pupils are equal, round, and reactive to light. Right eye exhibits no discharge. Left eye exhibits no discharge.  Neck: Normal range of motion. Neck supple. No JVD present. No thyromegaly present.  Cardiovascular: Normal rate, regular rhythm, normal heart sounds and intact distal pulses.  Exam reveals no gallop and no friction rub.   No murmur heard. Pulmonary/Chest: Effort normal and breath sounds normal.  Abdominal: Soft. Bowel sounds are normal. She exhibits no mass. There is no tenderness. There is no guarding.  Musculoskeletal: Normal range of motion. She exhibits no  edema.  Lymphadenopathy:    She has no cervical adenopathy.  Neurological: She is alert. She has normal reflexes.  Skin: Skin is warm and dry. She is not diaphoretic.  Psychiatric: Mood and affect normal.      Assessment & Plan  Problem List Items Addressed This Visit    None    Visit Diagnoses    Severe single current episode of major depressive disorder, without psychotic features ([place])    -  Primary    note sent to  gyn          Dr. Otilio Miu [facility]  12/17/2014

## 2015-01-22 ENCOUNTER — Encounter: Payer: Self-pay | Admitting: Family Medicine

## 2015-01-22 ENCOUNTER — Ambulatory Visit (INDEPENDENT_AMBULATORY_CARE_PROVIDER_SITE_OTHER): Payer: BC Managed Care – PPO | Admitting: Family Medicine

## 2015-01-22 VITALS — BP 100/62 | HR 60 | Ht 66.0 in | Wt 157.0 lb

## 2015-01-22 DIAGNOSIS — F329 Major depressive disorder, single episode, unspecified: Secondary | ICD-10-CM

## 2015-01-22 DIAGNOSIS — F32A Depression, unspecified: Secondary | ICD-10-CM

## 2015-01-22 DIAGNOSIS — F322 Major depressive disorder, single episode, severe without psychotic features: Secondary | ICD-10-CM

## 2015-01-22 MED ORDER — SERTRALINE HCL 50 MG PO TABS: 50.0000 mg | ORAL_TABLET | Freq: Every day | ORAL | Status: DC

## 2015-01-22 NOTE — Progress Notes (Signed)
Name: Emily Sawyer   MRN: [id number]    DOB: [birth year]   Date:01/22/2015       Progress Note  Subjective  Chief Complaint  Chief Complaint  Patient presents with  . Depression    Depression      The patient presents with depression.  This is a new problem.  The current episode started more than 1 month ago.   The onset quality is gradual.   The problem occurs every several days.  The problem has been gradually improving since onset.  Associated symptoms include insomnia.  Associated symptoms include no decreased concentration, no fatigue, no helplessness, no hopelessness, not irritable, no restlessness, no decreased interest, no myalgias, no headaches, not sad and no suicidal ideas.( Some early awakening)     The symptoms are aggravated by work stress.  Past treatments include SSRIs - Selective serotonin reuptake inhibitors.  Compliance with treatment is good.  Previous treatment provided mild relief.  Past medical history includes anxiety and depression.     Pertinent negatives include no chronic pain.  (improving)   No problem-specific assessment & plan notes found for this encounter.   Past Medical History  Diagnosis Date  . Depression     History reviewed. No pertinent past surgical history.  History reviewed. No pertinent family history.  Social History   Social History  . Marital Status: Married    Spouse Name: N/A  . Number of Children: N/A  . Years of Education: N/A   Occupational History  . Not on file.   Social History Main Topics  . Smoking status: Never Smoker   . Smokeless tobacco: Not on file  . Alcohol Use: 0.0 oz/week    0 Standard drinks or equivalent per week  . Drug Use: Not on file  . Sexual Activity: Not Currently   Other Topics Concern  . Not on file   Social History Narrative    Allergies  Allergen Reactions  . Codeine      Review of Systems  Constitutional: Negative for fever, chills, weight loss, malaise/fatigue and fatigue.  HENT:  Negative for ear discharge, ear pain and sore throat.   Eyes: Negative for blurred vision.  Respiratory: Negative for cough, sputum production, shortness of breath and wheezing.   Cardiovascular: Negative for chest pain, palpitations and leg swelling.  Gastrointestinal: Negative for heartburn, nausea, abdominal pain, diarrhea, constipation, blood in stool and melena.  Genitourinary: Negative for dysuria, urgency, frequency and hematuria.  Musculoskeletal: Negative for myalgias, back pain, joint pain and neck pain.  Skin: Negative for rash.  Neurological: Negative for dizziness, tingling, sensory change, focal weakness and headaches.  Endo/Heme/Allergies: Negative for environmental allergies and polydipsia. Does not bruise/bleed easily.  Psychiatric/Behavioral: Positive for depression. Negative for suicidal ideas and decreased concentration. The patient has insomnia. The patient is not nervous/anxious.      Objective  Filed Vitals:   01/22/15 0903  BP: 100/62  Pulse: 60  Height: 5\' 6"  (1.676 m)  Weight: 157 lb (71.215 kg)    Physical Exam  Constitutional: She is well-developed, well-nourished, and in no distress. She is not irritable. No distress.  HENT:  Head: Normocephalic and atraumatic.  Right Ear: External ear normal.  Left Ear: External ear normal.  Nose: Nose normal.  Mouth/Throat: Oropharynx is clear and moist.  Eyes: Conjunctivae and EOM are normal. Pupils are equal, round, and reactive to light. Right eye exhibits no discharge. Left eye exhibits no discharge.  Neck: Normal range of motion. Neck supple. No  JVD present. No thyromegaly present.  Cardiovascular: Normal rate, regular rhythm, normal heart sounds and intact distal pulses.  Exam reveals no gallop and no friction rub.   No murmur heard. Pulmonary/Chest: Effort normal and breath sounds normal.  Abdominal: Soft. Bowel sounds are normal. She exhibits no mass. There is no tenderness. There is no guarding.   Musculoskeletal: Normal range of motion. She exhibits no edema.  Lymphadenopathy:    She has no cervical adenopathy.  Neurological: She is alert. She has normal reflexes.  Skin: Skin is warm and dry. She is not diaphoretic.  Psychiatric: Mood and affect normal.      Assessment & Plan  Problem List Items Addressed This Visit    None    Visit Diagnoses    Depression    -  Primary    stable on med    Relevant Medications    sertraline (ZOLOFT) 50 MG tablet    Severe single current episode of major depressive disorder, without psychotic features ([place])        note sent to gyn     Relevant Medications    sertraline (ZOLOFT) 50 MG tablet         Dr. Mac[facility]  01/22/2015

## 2016-09-17 ENCOUNTER — Ambulatory Visit: Payer: Self-pay | Admitting: Obstetrics and Gynecology

## 2016-09-20 ENCOUNTER — Ambulatory Visit (INDEPENDENT_AMBULATORY_CARE_PROVIDER_SITE_OTHER): Payer: BC Managed Care – PPO | Admitting: Obstetrics and Gynecology

## 2016-09-20 ENCOUNTER — Encounter: Payer: Self-pay | Admitting: Obstetrics and Gynecology

## 2016-09-20 VITALS — BP 118/78 | Ht 66.0 in | Wt 177.0 lb

## 2016-09-20 DIAGNOSIS — Z01419 Encounter for gynecological examination (general) (routine) without abnormal findings: Secondary | ICD-10-CM | POA: Diagnosis not present

## 2016-09-20 DIAGNOSIS — N809 Endometriosis, unspecified: Secondary | ICD-10-CM

## 2016-09-20 DIAGNOSIS — Z1331 Encounter for screening for depression: Secondary | ICD-10-CM

## 2016-09-20 DIAGNOSIS — N801 Endometriosis of ovary: Secondary | ICD-10-CM | POA: Diagnosis not present

## 2016-09-20 DIAGNOSIS — Z1389 Encounter for screening for other disorder: Secondary | ICD-10-CM

## 2016-09-20 DIAGNOSIS — Z1339 Encounter for screening examination for other mental health and behavioral disorders: Secondary | ICD-10-CM

## 2016-09-20 DIAGNOSIS — N80129 Deep endometriosis of ovary, unspecified ovary: Secondary | ICD-10-CM | POA: Insufficient documentation

## 2016-09-20 NOTE — Progress Notes (Signed)
Gynecology Annual Exam  PCP: Juline Patch, MD  Chief Complaint  Patient presents with  . Annual Exam  . Menstrual Problem   History of Present Illness:  Ms. Emily Sawyer is a 41 y.o. G3P0030 who LMP was Patient's last menstrual period was 08/22/2016., presents today for her annual examination.  Her menses are regular every 28-30 days, lasting 7 day(s).  Dysmenorrhea moderate, occurring throughout menses. She does not have intermenstrual bleeding. Notes a 40-month history heavier vaginal bleeding, more painful menses. She has also noted some bruising on her left flank abdomen after exercise, such as sit ups. Denies hemarthroses, epistaxis, gums bleeding.  No history of bleeding dyscrasia.  Notably, she does have a history endometriosis and bilateral endometriomas, which she has so far declined to have removed.  She also notes some right leg "clumsiness" during her menses.  She states that she finds herself tripping up, etc.   She does not have vasomotor sx.   She is single partner, contraception - none. She does not have vaginal dryness.  Last Pap: 2 years ago, normal, HPV negative Hx of STDs: none  Last mammogram: n/a There is no FH of breast cancer. There is no FH of ovarian cancer. The patient does not do self-breast exams.  Colonoscopy: n/a DEXA: has not been screened for osteoporosis  Tobacco use: The patient denies current or previous tobacco use. Alcohol use: social drinker Exercise: moderately active  The patient wears seatbelts: yes.     Past Medical History:  Diagnosis Date  . Depression   . Endometriosis     Past Surgical History:  Procedure Laterality Date  . GALLBLADDER SURGERY    . LAPAROSCOPY        Medication Sig Start Date End Date Taking? Authorizing Provider  cetirizine (ZYRTEC) 10 MG tablet Take 10 mg by mouth daily.   Yes [provider]  sertraline (ZOLOFT) 50 MG tablet Take 1 tablet (50 mg total) by mouth daily. Take 1/2 tablet for 10  days, then 1 qday every day after that. Patient not taking: Reported on 09/20/2016 01/22/15   Juline Patch, MD    Allergies  Allergen Reactions  . Codeine     Gynecologic History:  Patient's last menstrual period was 08/22/2016. Contraception: none  Obstetric History: G3P0030, s/p SAB x 3.   Family History  Problem Relation Age of Onset  . Hypertension Mother   . Glaucoma Mother   . Hypertension Father     Social History   Social History  . Marital status: Married    Spouse name: N/A  . Number of children: N/A  . Years of education: N/A   Occupational History  . Not on file.   Social History Main Topics  . Smoking status: Never Smoker  . Smokeless tobacco: Never Used  . Alcohol use 0.0 oz/week  . Drug use: No  . Sexual activity: Yes    Birth control/ protection: None   Other Topics Concern  . Not on file   Social History Narrative  . No narrative on file    Review of Systems  Constitutional: Negative.   HENT: Negative.   Eyes: Negative.   Respiratory: Negative.   Cardiovascular: Negative.   Gastrointestinal: Negative.   Genitourinary: Negative.   Musculoskeletal: Negative.   Skin: Negative.   Neurological: Negative.   Psychiatric/Behavioral: Negative.      Physical Exam BP 118/78   Ht 5\' 6"  (1.676 m)   Wt 177 lb (80.3 kg)   LMP  08/22/2016   BMI 28.57 kg/m   Physical Exam  Constitutional: She is oriented to person, place, and time. She appears well-developed and well-nourished. No distress.  Genitourinary: Uterus normal. Pelvic exam was performed with patient supine. There is no rash, tenderness or lesion on the right labia. There is no rash, tenderness or lesion on the left labia. Vagina exhibits no lesion. No erythema, tenderness or bleeding in the vagina. No signs of injury around the vagina. No vaginal discharge found.  Right adnexum displays fullness.  Left adnexum displays fullness. Cervix does not exhibit motion tenderness, lesion,  discharge or polyp. Uterus is not enlarged, tender or exhibiting a mass.  Genitourinary Comments: Some mild fullness noted posteriorly. Did not perform a rectovaginal exam given her history of bilateral endometriomas that were posteriorly located on her last ultrasound  HENT:  Head: Normocephalic and atraumatic.  Eyes: EOM are normal. No scleral icterus.  Neck: Normal range of motion. Neck supple. No thyromegaly present.  Cardiovascular: Normal rate and regular rhythm.  Exam reveals no gallop and no friction rub.   No murmur heard. Pulmonary/Chest: Effort normal and breath sounds normal. No respiratory distress. She has no wheezes. She has no rales.  Abdominal: Soft. Bowel sounds are normal. She exhibits no distension and no mass. There is no tenderness. There is no rebound and no guarding.  Musculoskeletal: Normal range of motion. She exhibits no edema.  Lymphadenopathy:    She has no cervical adenopathy.  Neurological: She is alert and oriented to person, place, and time. No cranial nerve deficit.  Skin: Skin is warm and dry. No erythema.  Psychiatric: She has a normal mood and affect. Her behavior is normal. Judgment normal.   Female chaperone present for pelvic and breast  portions of the physical exam  Results: AUDIT Questionnaire (screen for alcoholism): 1 PHQ-9: 4  Assessment: 41 y.o. G78P0030 female here for routine gynecologic examination.  Plan: Problem List Items Addressed This Visit    Endometriosis    Discussed pain management related to endometriosis. Most therapy will interfere with her attempts to "not prevent" pregnancy. She does want to become pregnant. But, she is neither actively trying to prevent pregnancy nor conceive.  Discussed that we may have to remove her endometriomas, if they are still present. Will consider sending her to [facility] given the possible severity of her disease with two large endometriomas. Will discuss further pending ultrasound results.  So far she  has been resistant to having them removed. I did discuss association with endometriosis/endometriomas and slight increased risk of ovarian cancer.       Relevant Orders   US Transvaginal Non-OB   Endometrioma of ovary    Plan repeat ultrasound to assess as no ultrasound done since 11/2015.       Relevant Orders   US Transvaginal Non-OB    Other Visit Diagnoses    Women's annual routine gynecological examination    -  Primary   Relevant Orders   US Transvaginal Non-OB   Screening for depression       Screening for alcohol problem          Screening: -- Blood pressure screen normal -- Colonoscopy - not due -- Mammogram - due. Patient to call [facility] to arrange. She understands that it is her responsibility to arrange this. -- Weight screening: normal -- Depression screening negative (PHQ-9) -- Nutrition: normal -- cholesterol screening: not due for screening -- osteoporosis screening: not due -- tobacco screening: not using -- alcohol screening:  AUDIT questionnaire indicates low-risk usage. -- family history of breast cancer screening: done. not at high risk. -- no evidence of domestic violence or intimate partner violence. -- STD screening: gonorrhea/chlamydia NAAT not collected per patient request. -- pap smear not collected per ASCCP guidelines -- HPV vaccination series: not eligilbe  Prentice Docker, MD 09/21/2016 3:12 PM

## 2016-09-21 NOTE — Assessment & Plan Note (Addendum)
Discussed pain management related to endometriosis. Most therapy will interfere with her attempts to "not prevent" pregnancy. She does want to become pregnant. But, she is neither actively trying to prevent pregnancy nor conceive.  Discussed that we may have to remove her endometriomas, if they are still present. Will consider sending her to [facility] given the possible severity of her disease with two large endometriomas. Will discuss further pending ultrasound results.  So far she has been resistant to having them removed. I did discuss association with endometriosis/endometriomas and slight increased risk of ovarian cancer.

## 2016-09-21 NOTE — Assessment & Plan Note (Signed)
Plan repeat ultrasound to assess as no ultrasound done since 11/2015.

## 2016-09-29 ENCOUNTER — Ambulatory Visit (INDEPENDENT_AMBULATORY_CARE_PROVIDER_SITE_OTHER): Payer: BC Managed Care – PPO

## 2016-09-29 ENCOUNTER — Ambulatory Visit (INDEPENDENT_AMBULATORY_CARE_PROVIDER_SITE_OTHER): Payer: BC Managed Care – PPO | Admitting: Obstetrics and Gynecology

## 2016-09-29 VITALS — BP 112/72 | Ht 66.0 in | Wt 174.0 lb

## 2016-09-29 DIAGNOSIS — N801 Endometriosis of ovary: Secondary | ICD-10-CM

## 2016-09-29 DIAGNOSIS — N809 Endometriosis, unspecified: Secondary | ICD-10-CM

## 2016-09-29 DIAGNOSIS — Z01419 Encounter for gynecological examination (general) (routine) without abnormal findings: Secondary | ICD-10-CM | POA: Diagnosis not present

## 2016-09-29 DIAGNOSIS — N80129 Deep endometriosis of ovary, unspecified ovary: Secondary | ICD-10-CM

## 2016-09-29 DIAGNOSIS — N946 Dysmenorrhea, unspecified: Secondary | ICD-10-CM | POA: Diagnosis not present

## 2016-09-29 DIAGNOSIS — N920 Excessive and frequent menstruation with regular cycle: Secondary | ICD-10-CM

## 2016-09-29 NOTE — Progress Notes (Signed)
   Gynecology Ultrasound Follow Up  Chief Complaint: ultrasound follow up, menorrhagia, dysmenorrhea, history of bilateral ovarian cysts   History of Present Illness: Patient is a 41 y.o. female who presents today for ultrasound evaluation of the above.  Ultrasound demonstrates the following findings Adnexa:  Right ovarian cyst (5.1 x 4.2 x 4.6 cm), left ovarian cyst (5.4 x 4.2 x 4.8 cm). There is also a cyst midline (unable to tell which ovary it is related to) 5. 1 x 4.5 cm, simple appearing.  Cysts likely consistent with endometriomas versus fibrothecoma. Essentially unchanged from prior.  Uterus: anterverted with endometrial stripe  7.8 mm Additional: two fibroids; 1) left anterior fundal and intramural, 2) anterior, likely intramural with possible submucosal extension.   Past Medical History:  Diagnosis Date  . Depression   . Endometriosis     Past Surgical History:  Procedure Laterality Date  . GALLBLADDER SURGERY    . LAPAROSCOPY      Family History  Problem Relation Age of Onset  . Hypertension Mother   . Glaucoma Mother   . Hypertension Father     Social History   Social History  . Marital status: Married    Spouse name: N/A  . Number of children: N/A  . Years of education: N/A   Occupational History  . Not on file.   Social History Main Topics  . Smoking status: Never Smoker  . Smokeless tobacco: Never Used  . Alcohol use 0.0 oz/week  . Drug use: No  . Sexual activity: Yes    Birth control/ protection: None   Other Topics Concern  . Not on file   Social History Narrative  . No narrative on file    Allergies  Allergen Reactions  . Codeine       Medication Sig Start Date End Date Taking? Authorizing Provider  cetirizine (ZYRTEC) 10 MG tablet Take 10 mg by mouth daily.    [provider]    Physical Exam BP 112/72   Ht 5\' 6"  (1.676 m)   Wt 174 lb (78.9 kg)   BMI 28.08 kg/m    General: NAD HEENT: normocephalic,  anicteric Pulmonary: No increased work of breathing Extremities: no edema, erythema, or tenderness Neurologic: Grossly intact, normal gait Psychiatric: mood appropriate, affect full   Assessment: 41 y.o. G3P0030 with a history of bilateral ovarian cysts, most apparently consistent with endometriomas, but could possibly be other types of cysts (fibrothecomas).  These are long-standing and patient will be referred to [facility] minimally invasive surgery to get a consultation on surgery to improve pain and menorrhagia and chances of fertility.   Plan: Problem List Items Addressed This Visit    Endometriosis   Relevant Orders   Ambulatory referral to Pain Clinic   Endometrioma of ovary - Primary   Relevant Orders   Ambulatory referral to Pain Clinic   Dysmenorrhea   Menorrhagia with regular cycle     15 minutes spent in face to face discussion with > 50% spent in counseling and management of her bilateral ovarian cysts, pelvic pain, and infertility.   Prentice Docker, MD 09/29/2016 7:22 PM

## 2017-02-10 DIAGNOSIS — Z9079 Acquired absence of other genital organ(s): Secondary | ICD-10-CM

## 2017-02-10 DIAGNOSIS — Z90722 Acquired absence of ovaries, bilateral: Secondary | ICD-10-CM

## 2017-02-10 DIAGNOSIS — Z90710 Acquired absence of both cervix and uterus: Secondary | ICD-10-CM | POA: Insufficient documentation

## 2017-02-21 ENCOUNTER — Other Ambulatory Visit: Payer: Self-pay

## 2017-02-21 ENCOUNTER — Encounter: Payer: Self-pay | Admitting: Emergency Medicine

## 2017-02-21 ENCOUNTER — Emergency Department
Admission: EM | Admit: 2017-02-21 | Discharge: 2017-02-21 | Disposition: A | Discharge status: Home / Self Care | Payer: BC Managed Care – PPO | Attending: Emergency Medicine | Admitting: Emergency Medicine

## 2017-02-21 DIAGNOSIS — S61217A Laceration without foreign body of left little finger without damage to nail, initial encounter: Secondary | ICD-10-CM | POA: Diagnosis not present

## 2017-02-21 DIAGNOSIS — Y939 Activity, unspecified: Secondary | ICD-10-CM | POA: Insufficient documentation

## 2017-02-21 DIAGNOSIS — Z23 Encounter for immunization: Secondary | ICD-10-CM | POA: Insufficient documentation

## 2017-02-21 DIAGNOSIS — S6992XA Unspecified injury of left wrist, hand and finger(s), initial encounter: Secondary | ICD-10-CM | POA: Diagnosis present

## 2017-02-21 DIAGNOSIS — Z79899 Other long term (current) drug therapy: Secondary | ICD-10-CM | POA: Insufficient documentation

## 2017-02-21 DIAGNOSIS — Y999 Unspecified external cause status: Secondary | ICD-10-CM | POA: Insufficient documentation

## 2017-02-21 DIAGNOSIS — W290XXA Contact with powered kitchen appliance, initial encounter: Secondary | ICD-10-CM | POA: Insufficient documentation

## 2017-02-21 DIAGNOSIS — Y92090 Kitchen in other non-institutional residence as the place of occurrence of the external cause: Secondary | ICD-10-CM | POA: Insufficient documentation

## 2017-02-21 MED ORDER — TETANUS-DIPHTH-ACELL PERTUSSIS 5-2.5-18.5 LF-MCG/0.5 IM SUSP
0.5000 mL | Freq: Once | INTRAMUSCULAR | Status: AC
  Administered 2017-02-21: 0.5 mL via INTRAMUSCULAR
  Filled 2017-02-21: qty 0.5

## 2017-02-21 MED ORDER — LIDOCAINE HCL (PF) 1 % IJ SOLN
5.0000 mL | Freq: Once | INTRAMUSCULAR | Status: AC
  Administered 2017-02-21: 5 mL via INTRADERMAL
  Filled 2017-02-21: qty 5

## 2017-02-21 MED ORDER — BACITRACIN ZINC 500 UNIT/GM EX OINT
1.0000 "application " | TOPICAL_OINTMENT | Freq: Two times a day (BID) | CUTANEOUS | Status: DC
  Administered 2017-02-21: 1 via TOPICAL
  Filled 2017-02-21: qty 0.9

## 2017-02-21 NOTE — ED Triage Notes (Signed)
Laceration to left 5th digit from opening blender.  Unknown TDAP.  Ambulatory without distress.  Able to move finger

## 2017-02-21 NOTE — ED Notes (Signed)
See triage note  States she was opening a blender   Laceration noted to left 5 th finger

## 2017-02-21 NOTE — ED Provider Notes (Signed)
[facility] Emergency Department Provider Note  ____________________________________________  Time seen: Approximately 12:32 PM  I have reviewed the triage vital signs and the nursing notes.   HISTORY  Chief Complaint Laceration   HPI Emily Sawyer is a 41 y.o. female who presents to the emergency department for treatment and evaluation of a laceration to her right pinky finger.  She states that she was opening the blender and the blade dropped out and cut the side of her finger as it was falling.  She is unsure of her last tetanus vaccination.  Bleeding is well controlled.  No alleviating measures have been attempted for this complaint.  Past Medical History:  Diagnosis Date  . Depression   . Endometriosis     Patient Active Problem List   Diagnosis Date Noted  . Dysmenorrhea 09/29/2016  . Menorrhagia with regular cycle 09/29/2016  . Endometriosis 09/20/2016  . Endometrioma of ovary 09/20/2016    Past Surgical History:  Procedure Laterality Date  . GALLBLADDER SURGERY    . LAPAROSCOPY      Prior to Admission medications   Medication Sig Start Date End Date Taking? Authorizing Provider  cetirizine (ZYRTEC) 10 MG tablet Take 10 mg by mouth daily.    [provider]  etodolac (LODINE) 500 MG tablet  08/11/16   [provider]  sertraline (ZOLOFT) 50 MG tablet Take 1 tablet (50 mg total) by mouth daily. Take 1/2 tablet for 10 days, then 1 qday every day after that. Patient not taking: Reported on 09/20/2016 01/22/15   Juline Patch, MD    Allergies Codeine and Other  Family History  Problem Relation Age of Onset  . Hypertension Mother   . Glaucoma Mother   . Hypertension Father     Social History Social History   Tobacco Use  . Smoking status: Never Smoker  . Smokeless tobacco: Never Used  Substance Use Topics  . Alcohol use: Yes    Alcohol/week: 0.0 oz  . Drug use: No    Review of Systems  Constitutional:  Negative for fever. Respiratory: Negative for cough or shortness of breath.  Musculoskeletal: Negative for myalgias Skin: Positive for laceration. Neurological: Negative for numbness or paresthesias. ____________________________________________   PHYSICAL EXAM:  VITAL SIGNS: ED Triage Vitals  Enc Vitals Group     BP 02/21/17 1047 129/74     Pulse Rate 02/21/17 1047 67     Resp 02/21/17 1047 18     Temp 02/21/17 1047 98.6 F (37 C)     Temp Source 02/21/17 1047 Oral     SpO2 02/21/17 1047 99 %     Weight 02/21/17 1045 180 lb (81.6 kg)     Height 02/21/17 1045 5\' 6"  (1.676 m)     Head Circumference --      Peak Flow --      Pain Score 02/21/17 1044 1     Pain Loc --      Pain Edu? --      Excl. in [place]? --      Constitutional: Well appearing. Eyes: Conjunctivae are clear without discharge or drainage. Nose: No rhinorrhea noted. Mouth/Throat: Airway is patent.  Neck: No stridor. Unrestricted range of motion observed.  Cardiovascular: Capillary refill is <3 seconds.  Respiratory: Respirations are even and unlabored.. Musculoskeletal: Unrestricted range of motion observed. Neurologic: Awake, alert, and oriented x 4.  Skin: 3 cm laceration noted to the lateral aspect of the small finger on the left hand.  ____________________________________________  LABS (all labs ordered are listed, but only abnormal results are displayed)  Labs Reviewed - No data to display ____________________________________________  EKG  Not indicated ____________________________________________  RADIOLOGY  Not indicated ____________________________________________   PROCEDURES  .Marland KitchenLaceration Repair Date/Time: 02/21/2017 6:20 PM Performed by: Victorino Dike, FNP Authorized by: Victorino Dike, FNP   Consent:    Consent obtained:  Verbal   Consent given by:  Patient   Risks discussed:  Infection, pain, retained foreign body, poor cosmetic result and poor wound healing Anesthesia  (see MAR for exact dosages):    Anesthesia method:  Local infiltration   Local anesthetic:  Lidocaine 1% w/o epi Laceration details:    Location:  Finger   Finger location:  L small finger Repair type:    Repair type:  Simple Pre-procedure details:    Preparation:  Patient was prepped and draped in usual sterile fashion Exploration:    Hemostasis achieved with:  Direct pressure   Wound exploration: entire depth of wound probed and visualized     Contaminated: no   Treatment:    Area cleansed with:  Saline and Betadine   Amount of cleaning:  Extensive   Irrigation solution:  Sterile saline   Irrigation method:  Syringe   Visualized foreign bodies/material removed: no   Skin repair:    Repair method:  Sutures   Suture size:  5-0   Suture material:  Nylon   Suture technique:  Simple interrupted   Number of sutures:  6 Approximation:    Approximation:  Close Post-procedure details:    Dressing:  Sterile dressing   Patient tolerance of procedure:  Tolerated well, no immediate complications   ____________________________________________   INITIAL IMPRESSION / ASSESSMENT AND PLAN / ED COURSE  Emily Sawyer is a 41 y.o. female who presents to the emergency department for evaluation and treatment after sustaining a laceration to her left pinky finger prior to arrival.  Tetanus vaccination was updated and wound was sutured without difficulty.  Wound care instructions were discussed with the patient.  She is to have the sutures removed in approximately 10 days and states an intention to [facility].  She was instructed to go sooner or return to the emergency department for any concerns of infection.  Medications  lidocaine (PF) (XYLOCAINE) 1 % injection 5 mL (5 mLs Intradermal Given by Other 02/21/17 1202)  Tdap (BOOSTRIX) injection 0.5 mL (0.5 mLs Intramuscular Given 02/21/17 1202)     Pertinent labs & imaging results that were available during my care of the patient  were reviewed by me and considered in my medical decision making (see chart for details). ____________________________________________   FINAL CLINICAL IMPRESSION(S) / ED DIAGNOSES  Final diagnoses:  Laceration of left little finger without foreign body without damage to nail, initial encounter    ED Discharge Orders    None       Note:  This document was prepared using Dragon voice recognition software and may include unintentional dictation errors.    Victorino Dike, FNP 02/21/17 1823    Earleen Newport, MD 02/23/17 [phone]

## 2017-02-21 NOTE — Discharge Instructions (Signed)
Do not get the sutured area wet for 24 hours. After 24 hours, shower/bathe as usual and pat the area dry. Change the bandage 2 times per day and apply antibiotic ointment. Leave open to air when at no risk of getting the area dirty, but cover at night before bed.   

## 2017-06-25 IMAGING — US US OB COMP LESS 14 WK
1 series · 12 of 28 positions shown · non-contrast
Comparison: No prior scans from this gestation.

CLINICAL DATA: 39-year-old pregnant female presents with 4 days of
vaginal bleeding. Quantitative beta HCG [DATE].

EDC by LMP: 07/08/2015, projecting to an expected gestational age of
8 weeks 0 days.
EXAM:
OBSTETRIC <14 WK US AND TRANSVAGINAL OB US
TECHNIQUE: Both transabdominal and transvaginal ultrasound examinations were
performed for complete evaluation of the gestation as well as the
maternal uterus, adnexal regions, and pelvic cul-de-sac.
Transvaginal technique was performed to assess early pregnancy.

[Series 1: us ob comp less 14 wk · 0.20mm/px · 12 of 151 slices shown]
[im 6/151]
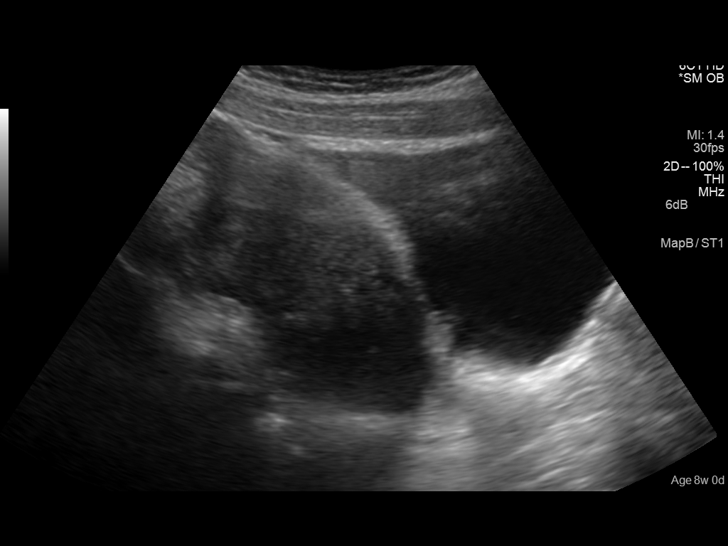
[im 17/151]
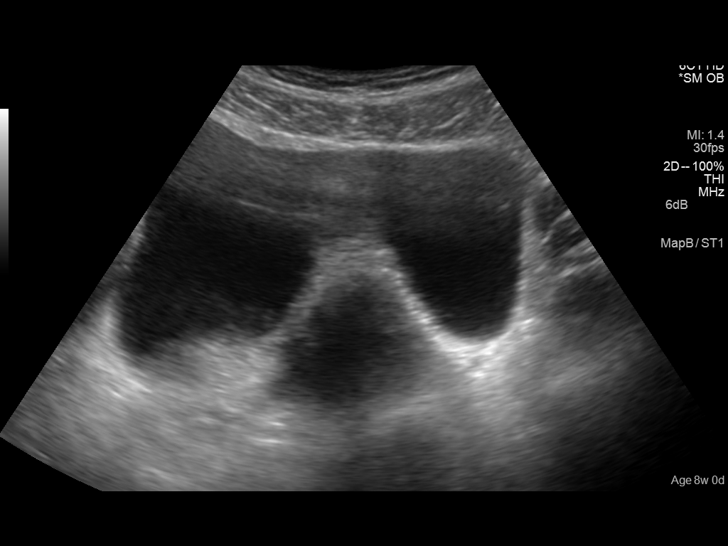
[im 28/151]
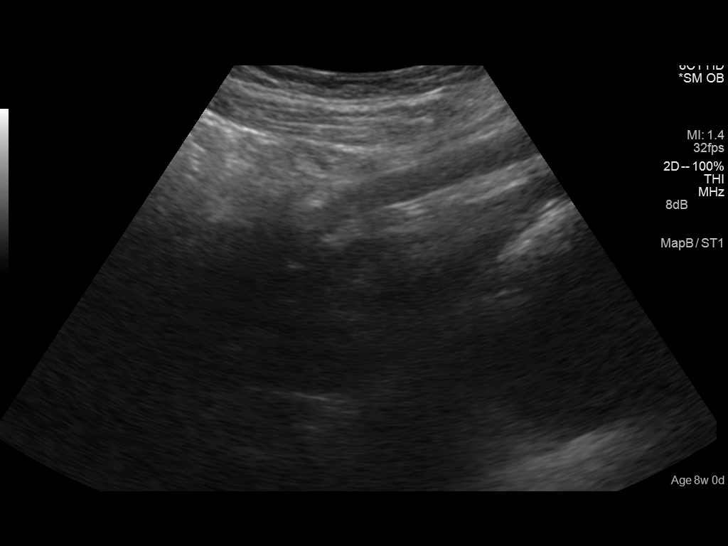
[im 45/151]
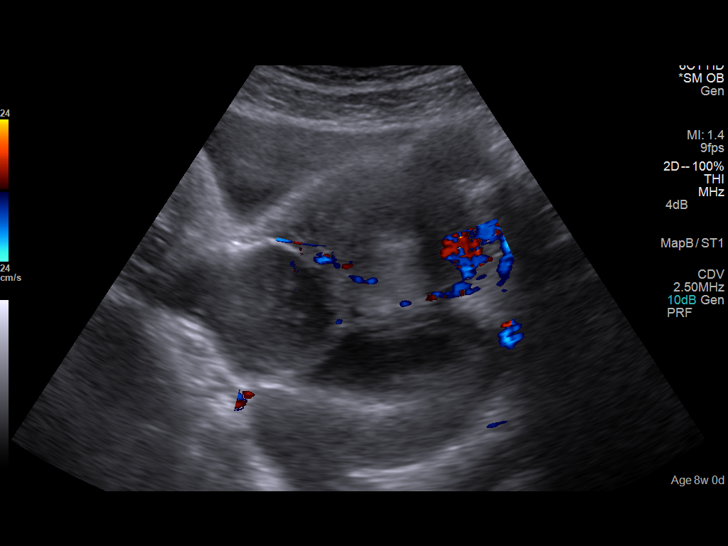
[im 56/151]
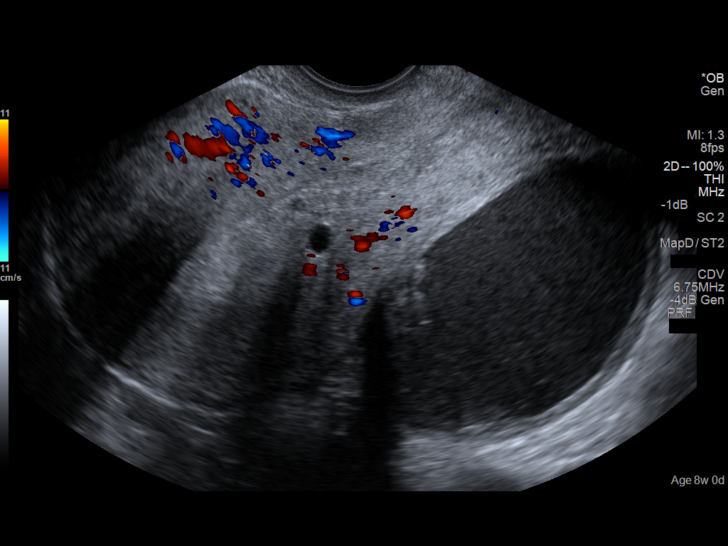
[im 67/151]
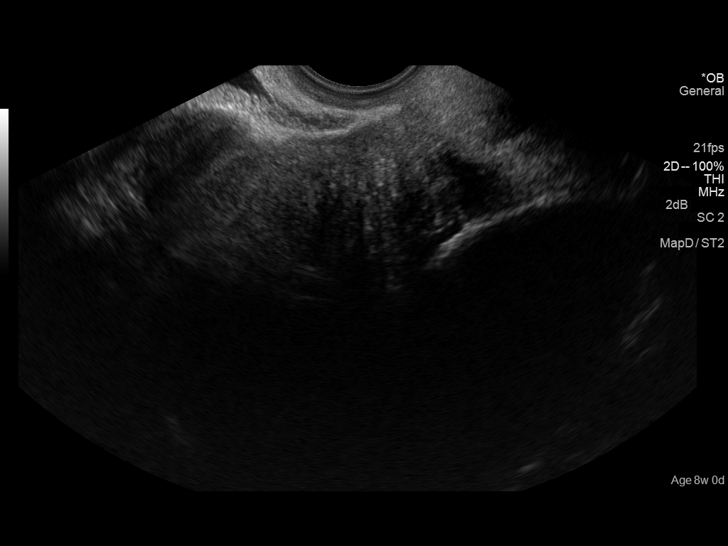
[im 84/151]
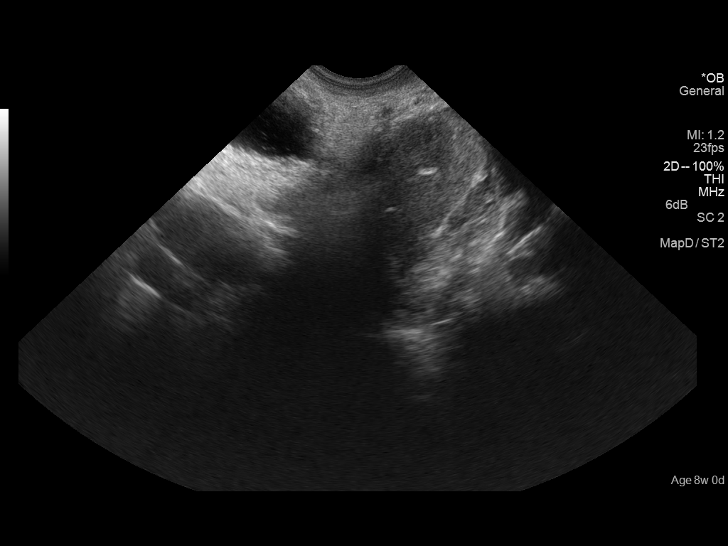
[im 95/151]
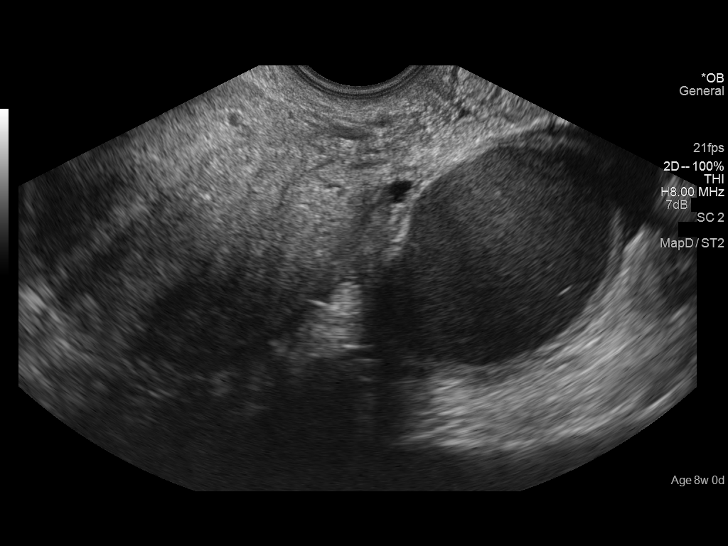
[im 106/151]
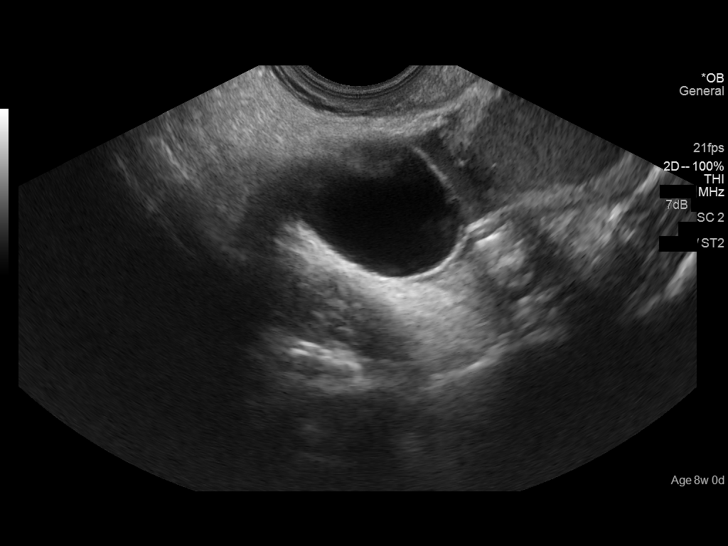
[im 123/151]
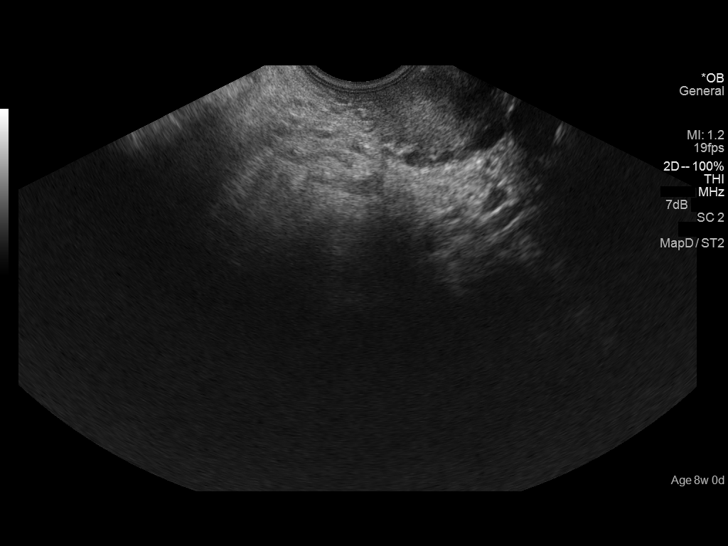
[im 134/151]
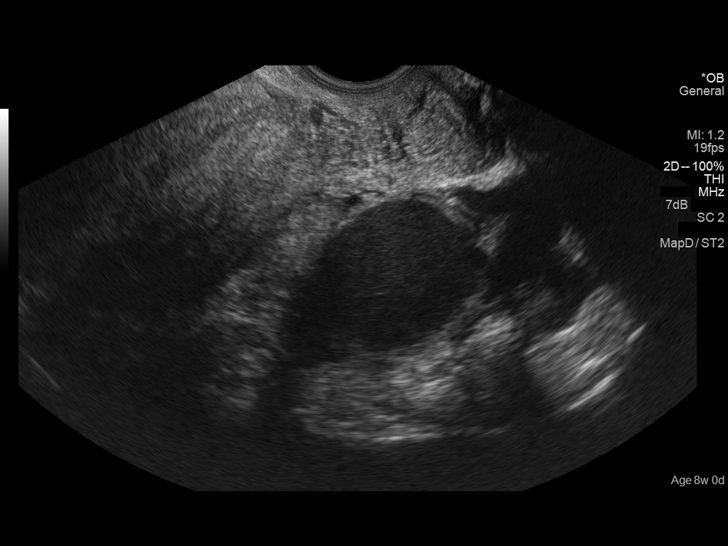
[im 145/151]
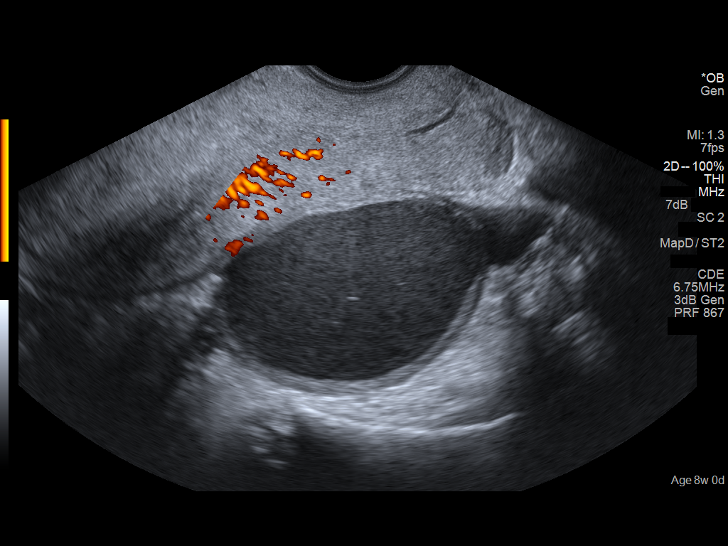

[12 of 28 positions shown; findings below may reference images not displayed]

FINDINGS: The anteverted anteflexed uterus contains uterine fibroids as
follows:

- left anterior upper uterine body subserosal partially exophytic
2.4 x 2.0 x 2.1 cm fibroid

- right anterior fundal subserosal partially exophytic 1.8 x 1.5 x
1.9 cm fibroid

There is no intrauterine gestational sac. In the posterior uterine
body at the junction of the endometrium and myometrium, there is a
nonspecific 0.5 x 0.7 x 0.6 cm thin-walled cyst with no internal
yolk sac, embryo or embryonic cardiac activity. This subcentimeter
cyst is favored to represent a myometrial cyst due to adenomyosis
given the diffuse refractory myometrial shadowing and diffuse
myometrial heterogeneity. No focal bleed is seen in the endometrium.
The cervix appears normal.

The right ovary is markedly enlarged, measuring 8.1 x 4.5 x 4.8 cm.
The right ovary contains a hypoechoic 4.2 x 3.6 x 4.5 cm mass with
coarse echogenic internal foci and otherwise homogeneous low-level
internal echoes and no internal vascularity, which could represent
interval growth of the previously described hypoechoic right ovarian
mass measuring 2.1 x 2.2 x 2.6 cm on the 06/25/13 pelvic sonogram,
suggestive of an enlarging right ovarian endometrioma. There is a
separate simple 4.2 x 3.1 x 3.3 cm right adnexal cyst, which likely
corresponds to the 3.9 x 2.3 x 3.1 cm right adnexal cyst described
on the 06/25/13 pelvic sonogram, minimally increased, likely a
paraovarian cyst.

The left ovary is enlarged, measuring 6.7 x 4.1 x 5.8 cm. The left
ovary is largely replaced by a hypoechoic 6.1 x 3.9 x 5.3 cm mass
with homogeneous low-level internal echoes and a few peripheral
echogenic foci, with no internal vascularity, which measured 6.4 x
3.5 cm on 06/25/13 pelvic sonogram, in keeping with a stable left
ovarian endometrioma.

No additional adnexal mass is demonstrated. No abnormal free fluid
is seen in the pelvis.
IMPRESSION: 1. Non-localization of the pregnancy on this ultrasound study. The
sonographic differential diagnosis includes a spontaneous abortion
or less likely an intrauterine gestation too early to visualize or
an occult ectopic gestation. Close clinical follow-up with serial
serum beta HCG monitoring is advised, with repeat pelvic ultrasound
as clinically warranted.
2. Uterine fibroids as described.
3. Superimposed uterine adenomyosis, see comments.
4. Bilateral large similar-appearing hypoechoic ovarian masses
without internal vascularity, suggestive of bilateral ovarian
endometriomas, increased on the right and stable on the left
compared to 06/25/13 pelvic sonogram. Continued pelvic sonographic
follow-up is advised for these ovarian masses if not resected.

## 2017-07-09 IMAGING — US US OB TRANSVAGINAL
1 series · 12 of 28 positions shown · non-contrast
Comparison: 11/26/2014 obstetric scan.

CLINICAL DATA: 39-year-old pregnant female with persistent vaginal
bleeding for 2 weeks and non localization of the pregnancy on prior
ultrasound from 14 days prior.

EDC by LMP: 07/08/2015, projecting to an expected gestational age of
10 weeks 0 days
EXAM:
OBSTETRIC <14 WK US AND TRANSVAGINAL OB US
TECHNIQUE: Both transabdominal and transvaginal ultrasound examinations were
performed for complete evaluation of the gestation as well as the
maternal uterus, adnexal regions, and pelvic cul-de-sac.
Transvaginal technique was performed to assess early pregnancy.

[Series 1: us ob transvaginal · 0.30mm/px · 12 of 121 slices shown]
[im 5/121]
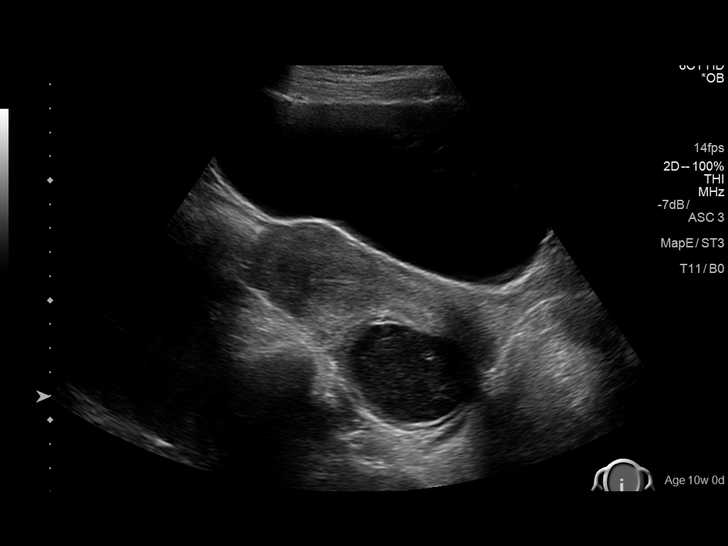
[im 14/121]
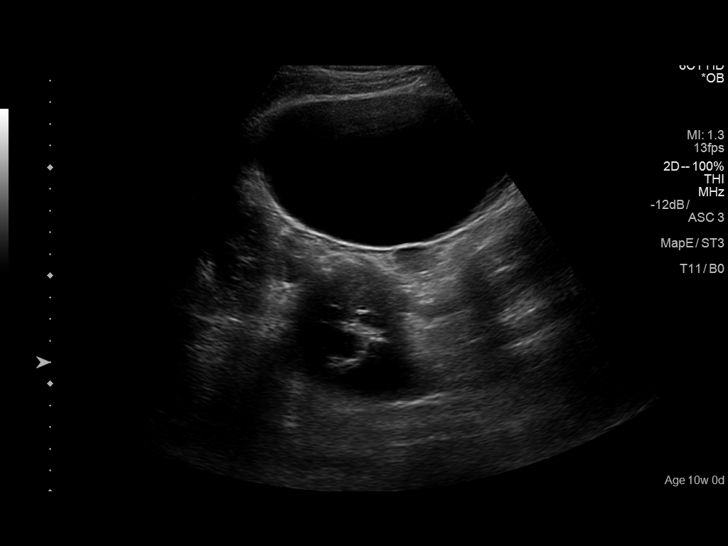
[im 23/121]
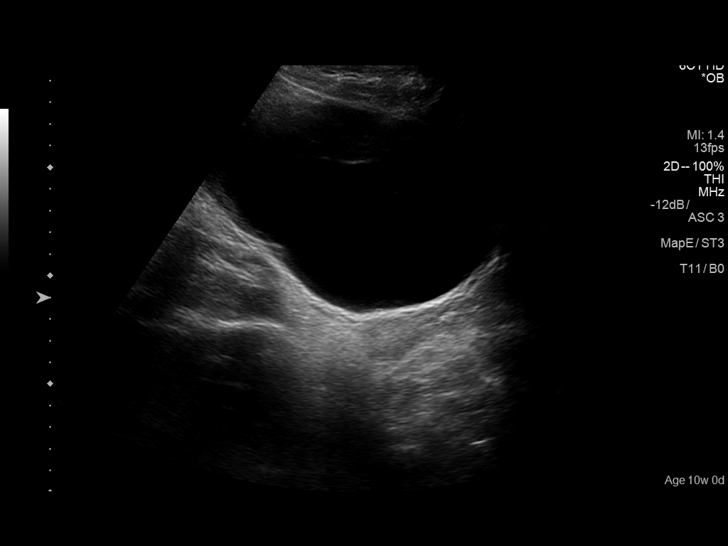
[im 36/121]
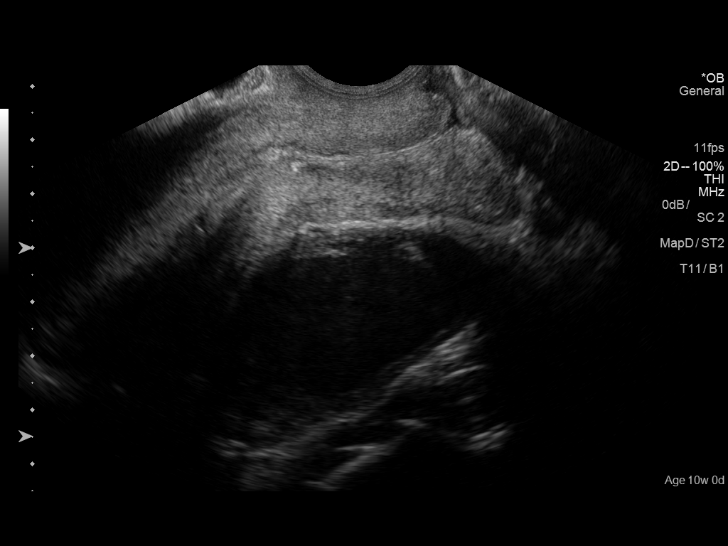
[im 45/121]
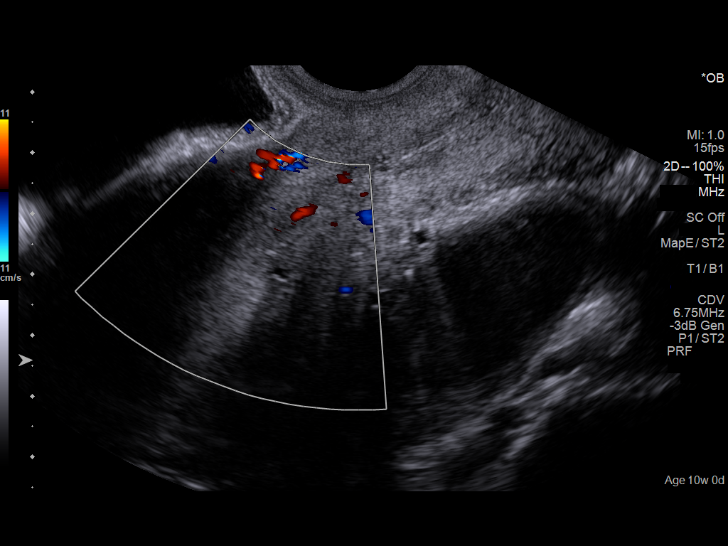
[im 54/121]
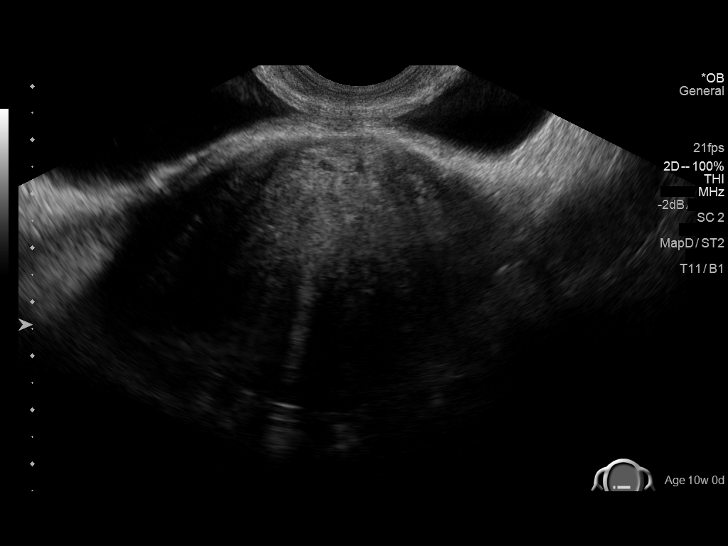
[im 67/121]
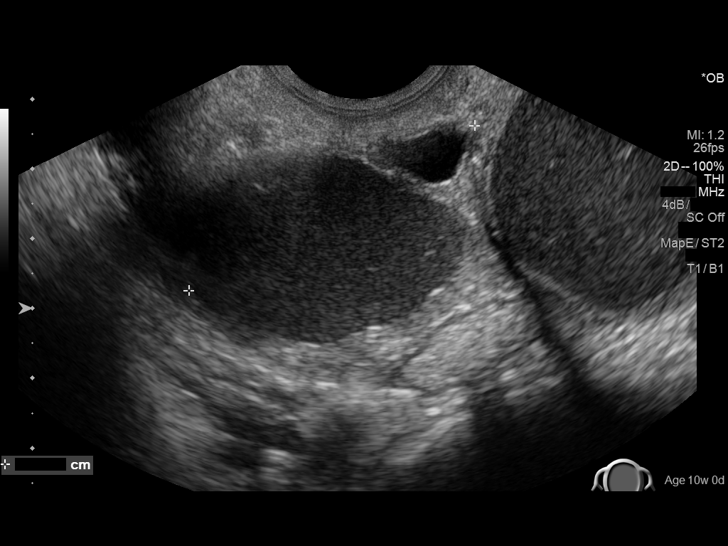
[im 76/121]
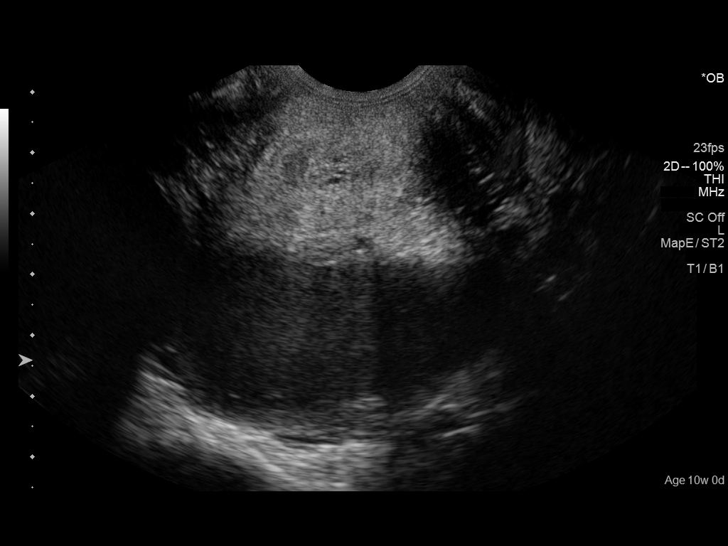
[im 85/121]
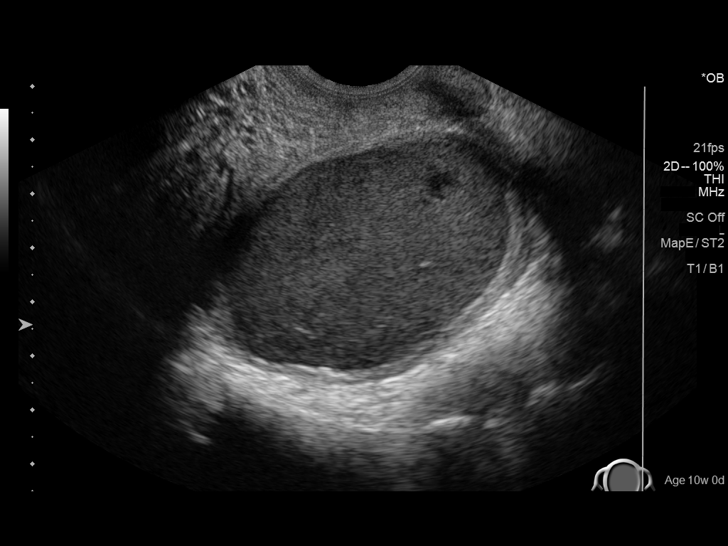
[im 98/121]
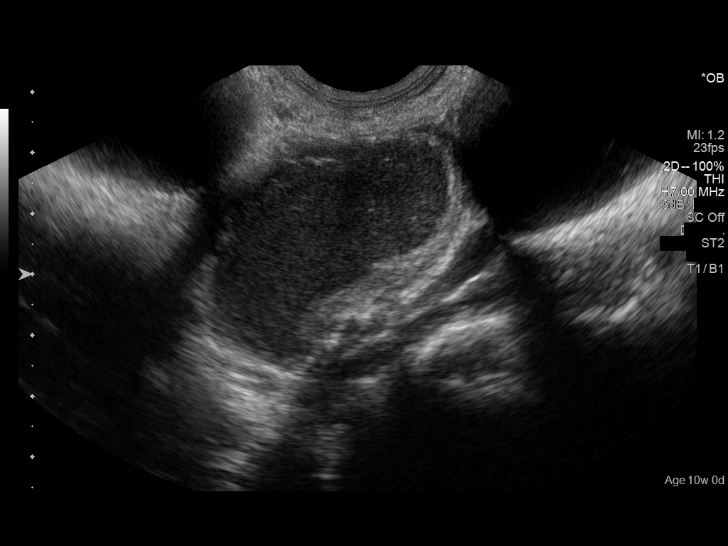
[im 107/121]
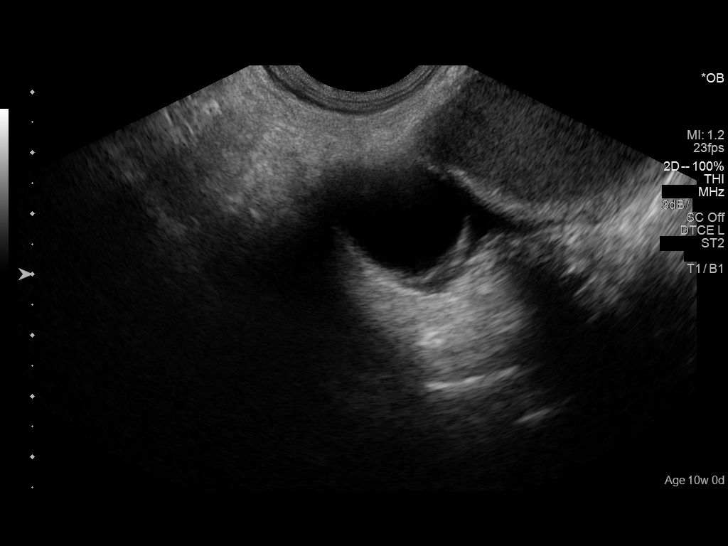
[im 116/121]
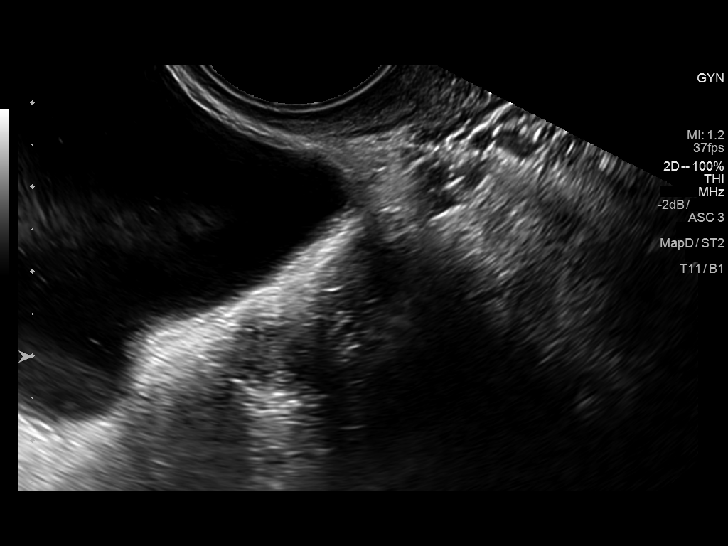

[12 of 28 positions shown; findings below may reference images not displayed]

FINDINGS: The anteverted uterus is mildly enlarged by uterine fibroids as
follows:

- right anterior fundal subserosal exophytic 2.0 x 2.0 x 1.6 cm
fibroid, previously 1.8 x 1.5 x 1.9 cm, not appreciably changed

- left anterior upper uterine body subserosal partially exophytic
2.3 x 1.9 x 2.1 cm fibroid, previously 2.4 x 2.0 x 2.1 cm, not
appreciably changed

The endometrial bilayer thickness is 8 mm. There is no evidence of
an intrauterine gestational sac. Re- demonstrated is a
subendometrial 0.4 cm myometrial cyst in the posterior uterine body,
slightly decreased from 0.7 cm on the prior scan, and indicative of
diffuse adenomyosis of the uterus given the diffuse myometrial
heterogeneity and refractory myometrial shadowing throughout the
uterus.

The right ovary measures 6.2 x 3.1 x 4.7 cm and contains a 5.8 x
x 4.5 cm hypoechoic mass with homogeneous low-level internal echoes
and no internal vascularity, which measured 4.2 x 3.6 x 4.5 cm on
the 11/26/2014 scan, and is overall not appreciably changed in size,
and is most in keeping with a right ovarian endometrioma. There is a
separate simple unilocular 4.1 x 2.2 x 2.4 cm right adnexal cyst,
previously 4.2 x 3.1 x 3.3 cm, not appreciably changed, most in
keeping with a parovarian cyst.

The left ovary measures 6.3 x 6.3 x 5.0 cm and contains a hypoechoic
5.8 x 4.0 x 5.3 cm mass with homogeneous low-level internal echoes
and a few peripheral echogenic foci, with no internal vascularity,
which previously measured 6.1 x 3.9 x 5.3 cm, not appreciably
changed, most in keeping with a left ovarian endometrioma.

There is a new hypoechoic 1.4 x 1.2 x 1.2 cm left adnexal mass just
lateral to the left upper uterine fibroid, which demonstrates
peripheral vascularity, and could represent a left tubal ectopic
pregnancy. No yolk sac, embryo or embryonic cardiac activity is seen
within this heterogeneous left adnexal mass. No abnormal free fluid
is seen in the pelvis.
IMPRESSION: 1. New heterogeneous hypoechoic 1.4 cm left adnexal mass with
peripheral vascularity, suspicious for a left tubal ectopic
pregnancy. No abnormal free fluid in the pelvis.
2. Stable enlarged myomatous uterus with superimposed diffuse
adenomyosis of the uterus. No evidence of an intrauterine
gestational sac.
3. Stable bilateral hypoechoic avascular ovarian masses, likely
representing bilateral ovarian endometriomas. Stable simple right
adnexal cystic structure, likely a paraovarian right adnexal cyst.

These results were called by telephone at the time of interpretation
on 12/10/2014 at [DATE] to Dr. Db Bartolon, who verbally
acknowledged these results.

## 2018-03-01 DIAGNOSIS — E039 Hypothyroidism, unspecified: Secondary | ICD-10-CM | POA: Insufficient documentation

## 2018-05-19 ENCOUNTER — Other Ambulatory Visit: Payer: Self-pay

## 2018-05-19 ENCOUNTER — Encounter: Payer: Self-pay | Admitting: Family Medicine

## 2018-05-19 ENCOUNTER — Ambulatory Visit: Payer: BC Managed Care – PPO | Admitting: Family Medicine

## 2018-05-19 VITALS — BP 110/72 | HR 64 | Ht 66.5 in | Wt 203.0 lb

## 2018-05-19 DIAGNOSIS — Z7689 Persons encountering health services in other specified circumstances: Secondary | ICD-10-CM | POA: Diagnosis not present

## 2018-05-19 DIAGNOSIS — L309 Dermatitis, unspecified: Secondary | ICD-10-CM | POA: Diagnosis not present

## 2018-05-19 DIAGNOSIS — R5383 Other fatigue: Secondary | ICD-10-CM | POA: Diagnosis not present

## 2018-05-19 DIAGNOSIS — E034 Atrophy of thyroid (acquired): Secondary | ICD-10-CM | POA: Diagnosis not present

## 2018-05-19 MED ORDER — LEVOTHYROXINE SODIUM 25 MCG PO TABS: 25.0000 ug | ORAL_TABLET | Freq: Every day | ORAL | 0 refills | Status: DC

## 2018-05-19 MED ORDER — TRIAMCINOLONE ACETONIDE 0.1 % EX CREA: 1.0000 "application " | TOPICAL_CREAM | Freq: Two times a day (BID) | CUTANEOUS | 0 refills | Status: AC

## 2018-05-19 NOTE — Progress Notes (Signed)
Date:  05/19/2018   Name:  Emily Sawyer   DOB:  Jan 15, 1976   MRN:  [id number]   Chief Complaint: Establish Care (wants T4 rechecked- was told she needed pcp for this)  Patient is a 43 year old female who presents for a comprehensive physical exam. The patient reports the following problems: ? depression. Health maintenance has been reviewed   Thyroid Problem  Presents for initial visit. Symptoms include anxiety, cold intolerance, depressed mood and weight gain. Patient reports no constipation, diaphoresis, diarrhea, dry skin, fatigue, hair loss, heat intolerance, hoarse voice, leg swelling, menstrual problem, nail problem, palpitations, tremors, visual change or weight loss. The symptoms have been worsening.    Review of Systems  Constitutional: Positive for weight gain. Negative for chills, diaphoresis, fatigue, fever, unexpected weight change and weight loss.  HENT: Negative for congestion, ear discharge, ear pain, hoarse voice, rhinorrhea, sinus pressure, sneezing and sore throat.   Eyes: Negative for photophobia, pain, discharge, redness and itching.  Respiratory: Negative for cough, shortness of breath, wheezing and stridor.   Cardiovascular: Negative for palpitations.  Gastrointestinal: Negative for abdominal pain, blood in stool, constipation, diarrhea, nausea and vomiting.  Endocrine: Positive for cold intolerance. Negative for heat intolerance, polydipsia, polyphagia and polyuria.  Genitourinary: Negative for dysuria, flank pain, frequency, hematuria, menstrual problem, pelvic pain, urgency, vaginal bleeding and vaginal discharge.  Musculoskeletal: Negative for arthralgias, back pain and myalgias.  Skin: Negative for rash.  Allergic/Immunologic: Negative for environmental allergies and food allergies.  Neurological: Negative for dizziness, tremors, weakness, light-headedness, numbness and headaches.  Hematological: Negative for adenopathy. Does not bruise/bleed easily.   Psychiatric/Behavioral: Negative for dysphoric mood. The patient is nervous/anxious.     Patient Active Problem List   Diagnosis Date Noted  . Thyroid activity decreased 03/01/2018  . H/O total hysterectomy with removal of both tubes and ovaries 02/10/2017  . Dysmenorrhea 09/29/2016  . Menorrhagia with regular cycle 09/29/2016  . Endometriosis 09/20/2016  . Endometrioma of ovary 09/20/2016    Allergies  Allergen Reactions  . Codeine   . Other     dermabond-hives  . Latex Itching    Past Surgical History:  Procedure Laterality Date  . ABDOMINAL HYSTERECTOMY    . GALLBLADDER SURGERY    . LAPAROSCOPY      Social History   Tobacco Use  . Smoking status: Never Smoker  . Smokeless tobacco: Never Used  Substance Use Topics  . Alcohol use: Yes    Alcohol/week: 0.0 standard drinks  . Drug use: No     Medication list has been reviewed and updated.  Current Meds  Medication Sig  . estrogens-methylTEST (ESTRATEST) 1.25-2.5 MG tablet Take 2 tablets by mouth daily. Psychologist, sport and exercise  . Multiple Vitamins-Minerals (MULTIVITAMIN GUMMIES ADULT) CHEW Chew 2 each by mouth daily. otc    PHQ 2/9 Scores 05/19/2018 01/28/2014  PHQ - 2 Score 0 0  PHQ- 9 Score 4 -    Physical Exam Vitals signs and nursing note reviewed.  Constitutional:      General: She is not in acute distress.    Appearance: She is not diaphoretic.  HENT:     Head: Normocephalic and atraumatic.     Right Ear: External ear normal.     Left Ear: External ear normal.     Nose: Nose normal.  Eyes:     General:        Right eye: No discharge.        Left eye: No discharge.  Conjunctiva/sclera: Conjunctivae normal.     Pupils: Pupils are equal, round, and reactive to light.  Neck:     Musculoskeletal: Normal range of motion and neck supple.     Thyroid: No thyromegaly.     Vascular: No JVD.  Cardiovascular:     Rate and Rhythm: Normal rate and regular rhythm.     Heart sounds: Normal heart sounds. No murmur. No  friction rub. No gallop.   Pulmonary:     Effort: Pulmonary effort is normal.     Breath sounds: Normal breath sounds.  Abdominal:     General: Bowel sounds are normal.     Palpations: Abdomen is soft. There is no mass.     Tenderness: There is no abdominal tenderness. There is no guarding.  Musculoskeletal: Normal range of motion.  Lymphadenopathy:     Cervical: No cervical adenopathy.  Skin:    General: Skin is warm and dry.  Neurological:     Mental Status: She is alert.     Deep Tendon Reflexes: Reflexes are normal and symmetric.     Wt Readings from Last 3 Encounters:  05/19/18 203 lb (92.1 kg)  02/21/17 180 lb (81.6 kg)  09/29/16 174 lb (78.9 kg)    BP 110/72   Pulse 64   Ht 5' 6.5" (1.689 m)   Wt 203 lb (92.1 kg)   LMP 08/22/2016   BMI 32.27 kg/m   Assessment and Plan: 1. Establishing care with new doctor, encounter for And establishes care with a new physician.  Previous visits were reviewed as well as labs, imaging, and specialists visits.  2. Fatigue, unspecified type Has an ongoing problem with fatigue.  Been some weight gain and patient brings in lab work that is consistent with low free T4.  We will trial a low dose supplement of Synthroid 25 mcg recheck in 6 weeks - levothyroxine (SYNTHROID, LEVOTHROID) 25 MCG tablet; Take 1 tablet (25 mcg total) by mouth daily before breakfast.  Dispense: 60 tablet; Refill: 0  3. Eczema, unspecified type Patient with a history of eczema/dermatitis after working in the yard.  Will trial triamcinolone cream applied twice a day. - triamcinolone cream (KENALOG) 0.1 %; Apply 1 application topically 2 (two) times daily.  Dispense: 30 g; Refill: 0  4. Hypothyroidism due to acquired atrophy of thyroid Patient has mentioned has a free T4 that was slightly decreased despite a normal TSH but because of her depression and weight gain we will trial a low-dose Synthroid supplement. - levothyroxine (SYNTHROID, LEVOTHROID) 25 MCG tablet;  Take 1 tablet (25 mcg total) by mouth daily before breakfast.  Dispense: 60 tablet; Refill: 0

## 2018-06-30 ENCOUNTER — Encounter: Payer: Self-pay | Admitting: Family Medicine

## 2018-06-30 ENCOUNTER — Other Ambulatory Visit: Payer: Self-pay

## 2018-06-30 ENCOUNTER — Ambulatory Visit: Payer: BC Managed Care – PPO | Admitting: Family Medicine

## 2018-06-30 VITALS — BP 110/60 | HR 72 | Ht 65.0 in | Wt 204.0 lb

## 2018-06-30 DIAGNOSIS — R635 Abnormal weight gain: Secondary | ICD-10-CM | POA: Diagnosis not present

## 2018-06-30 DIAGNOSIS — R5383 Other fatigue: Secondary | ICD-10-CM

## 2018-06-30 DIAGNOSIS — F330 Major depressive disorder, recurrent, mild: Secondary | ICD-10-CM

## 2018-06-30 DIAGNOSIS — E034 Atrophy of thyroid (acquired): Secondary | ICD-10-CM | POA: Diagnosis not present

## 2018-06-30 MED ORDER — FLUOXETINE HCL 10 MG PO TABS: 10.0000 mg | ORAL_TABLET | Freq: Every day | ORAL | 1 refills | Status: DC

## 2018-06-30 NOTE — Progress Notes (Signed)
Date:  06/30/2018   Name:  Emily Sawyer   DOB:  02-02-1976   MRN:  [id number]   Chief Complaint: Hypothyroidism (recheck thyroid level) and Depression (PHQ9=7)  Depression         This is a chronic problem.  The current episode started more than 1 year ago.   The onset quality is gradual.   The problem occurs every several days.  The problem has been gradually improving since onset.  Associated symptoms include fatigue, appetite change and sad.  Associated symptoms include no decreased concentration, no helplessness, no hopelessness, does not have insomnia, not irritable, no restlessness, no decreased interest, no body aches, no myalgias, no headaches, no indigestion and no suicidal ideas.     The symptoms are aggravated by medication (levothyroid).  Past treatments include nothing.  Compliance with treatment is good.  Previous treatment provided moderate relief.  Risk factors: working at home.    Review of Systems  Constitutional: Positive for appetite change and fatigue. Negative for chills, fever and unexpected weight change.  HENT: Negative for congestion, ear discharge, ear pain, rhinorrhea, sinus pressure, sneezing and sore throat.   Eyes: Negative for photophobia, pain, discharge, redness and itching.  Respiratory: Negative for cough, shortness of breath, wheezing and stridor.   Gastrointestinal: Negative for abdominal pain, blood in stool, constipation, diarrhea, nausea and vomiting.  Endocrine: Negative for cold intolerance, heat intolerance, polydipsia, polyphagia and polyuria.  Genitourinary: Negative for dysuria, flank pain, frequency, hematuria, menstrual problem, pelvic pain, urgency, vaginal bleeding and vaginal discharge.  Musculoskeletal: Negative for arthralgias, back pain and myalgias.  Skin: Negative for rash.  Allergic/Immunologic: Negative for environmental allergies and food allergies.  Neurological: Negative for dizziness, weakness, light-headedness, numbness and  headaches.  Hematological: Negative for adenopathy. Does not bruise/bleed easily.  Psychiatric/Behavioral: Positive for depression. Negative for decreased concentration, dysphoric mood and suicidal ideas. The patient is not nervous/anxious and does not have insomnia.     Patient Active Problem List   Diagnosis Date Noted  . Thyroid activity decreased 03/01/2018  . H/O total hysterectomy with removal of both tubes and ovaries 02/10/2017  . Dysmenorrhea 09/29/2016  . Menorrhagia with regular cycle 09/29/2016  . Endometriosis 09/20/2016  . Endometrioma of ovary 09/20/2016    Allergies  Allergen Reactions  . Codeine   . Other     dermabond-hives  . Latex Itching    Past Surgical History:  Procedure Laterality Date  . ABDOMINAL HYSTERECTOMY    . GALLBLADDER SURGERY    . LAPAROSCOPY      Social History   Tobacco Use  . Smoking status: Never Smoker  . Smokeless tobacco: Never Used  Substance Use Topics  . Alcohol use: Yes    Alcohol/week: 0.0 standard drinks  . Drug use: No     Medication list has been reviewed and updated.  Current Meds  Medication Sig  . estrogens-methylTEST (ESTRATEST) 1.25-2.5 MG tablet Take 2 tablets by mouth daily. Psychologist, sport and exercise  . levothyroxine (SYNTHROID, LEVOTHROID) 25 MCG tablet Take 1 tablet (25 mcg total) by mouth daily before breakfast.  . Multiple Vitamins-Minerals (MULTIVITAMIN GUMMIES ADULT) CHEW Chew 2 each by mouth daily. otc  . triamcinolone cream (KENALOG) 0.1 % Apply 1 application topically 2 (two) times daily.    PHQ 2/9 Scores 06/30/2018 05/19/2018 01/28/2014  PHQ - 2 Score 2 0 0  PHQ- 9 Score 7 4 -    BP Readings from Last 3 Encounters:  06/30/18 110/60  05/19/18 110/72  02/21/17 129/74  Physical Exam Vitals signs and nursing note reviewed.  Constitutional:      General: She is not irritable.She is not in acute distress.    Appearance: She is not diaphoretic.  HENT:     Head: Normocephalic and atraumatic.     Right Ear:  External ear normal.     Left Ear: External ear normal.     Nose: Nose normal.  Eyes:     General:        Right eye: No discharge.        Left eye: No discharge.     Conjunctiva/sclera: Conjunctivae normal.     Pupils: Pupils are equal, round, and reactive to light.  Neck:     Musculoskeletal: Normal range of motion and neck supple.     Thyroid: No thyromegaly.     Vascular: No JVD.  Cardiovascular:     Rate and Rhythm: Normal rate and regular rhythm.     Heart sounds: Normal heart sounds. No murmur. No friction rub. No gallop.   Pulmonary:     Effort: Pulmonary effort is normal. No respiratory distress.     Breath sounds: Normal breath sounds. No stridor. No wheezing or rhonchi.  Abdominal:     General: Bowel sounds are normal.     Palpations: Abdomen is soft. There is no mass.     Tenderness: There is no abdominal tenderness. There is no guarding.  Musculoskeletal: Normal range of motion.  Lymphadenopathy:     Cervical: No cervical adenopathy.  Skin:    General: Skin is warm and dry.  Neurological:     Mental Status: She is alert.     Deep Tendon Reflexes: Reflexes are normal and symmetric.     Wt Readings from Last 3 Encounters:  06/30/18 204 lb (92.5 kg)  05/19/18 203 lb (92.1 kg)  02/21/17 180 lb (81.6 kg)    BP 110/60   Pulse 72   Ht 5\' 5"  (1.651 m)   Wt 204 lb (92.5 kg)   LMP 08/22/2016   BMI 33.95 kg/m   Assessment and Plan: 1. Fatigue, unspecified type New onset but has been resident for several months.  No specific concerns on review of systems we will undergo a fatigue work-up with medications check and laboratory evaluation.  Will check TSH renal function panel sed rate and CBC with initial evaluation. - TSH - Renal Function Panel - Sedimentation rate - CBC with Differential/Platelet  2. Mild episode of recurrent major depressive disorder ([place]) Patient has had episodes of depression in the past including use of sertraline. Patient did not feel  very well on sertraline but we want to make certain that there is not any weight gain so we will initiate fluoxetine 10 mg low-dose to see as tolerated and recheck in 6 weeks.  HQ was noted to be [place] is her baseline.. - FLUoxetine (PROZAC) 10 MG tablet; Take 1 tablet (10 mg total) by mouth daily.  Dispense: 30 tablet; Refill: 1  3. Hypothyroidism due to acquired atrophy of thyroid Patient is on low-dose thyroid we will check a TSH to evaluate and may depending on the level test accordingly. - TSH - Lipid panel  4. Weight gain, abnormal She has had a 20 pound weight gain over the past 2 years will evaluate and check a lipid panel as well as initiate fluoxetine. - Lipid panel - FLUoxetine (PROZAC) 10 MG tablet; Take 1 tablet (10 mg total) by mouth daily.  Dispense: 30 tablet; Refill: 1

## 2018-06-30 NOTE — Patient Instructions (Signed)

## 2018-07-01 LAB — CBC WITH DIFFERENTIAL/PLATELET
Basophils Absolute: 0 10*3/uL (ref 0.0–0.2)
Basos: 0 %
EOS (ABSOLUTE): 0.2 10*3/uL (ref 0.0–0.4)
Eos: 3 %
Hematocrit: 42.1 % (ref 34.0–46.6)
Hemoglobin: 14.3 g/dL (ref 11.1–15.9)
Immature Grans (Abs): 0 10*3/uL (ref 0.0–0.1)
Immature Granulocytes: 0 %
Lymphocytes Absolute: 1.9 10*3/uL (ref 0.7–3.1)
Lymphs: 28 %
MCH: 31.2 pg (ref 26.6–33.0)
MCHC: 34 g/dL (ref 31.5–35.7)
MCV: 92 fL (ref 79–97)
Monocytes Absolute: 0.5 10*3/uL (ref 0.1–0.9)
Monocytes: 7 %
Neutrophils Absolute: 4.3 10*3/uL (ref 1.4–7.0)
Neutrophils: 62 %
Platelets: 278 10*3/uL (ref 150–450)
RBC: 4.58 x10E6/uL (ref 3.77–5.28)
RDW: 12.6 % (ref 11.7–15.4)
WBC: 7 10*3/uL (ref 3.4–10.8)

## 2018-07-01 LAB — RENAL FUNCTION PANEL
Albumin: 4.4 g/dL (ref 3.8–4.8)
BUN/Creatinine Ratio: 10 (ref 9–23)
BUN: 9 mg/dL (ref 6–24)
CO2: 23 mmol/L (ref 20–29)
Calcium: 9.3 mg/dL (ref 8.7–10.2)
Chloride: 103 mmol/L (ref 96–106)
Creatinine, Ser: 0.89 mg/dL (ref 0.57–1.00)
GFR calc Af Amer: 92 mL/min/{1.73_m2} (ref >59)
GFR calc non Af Amer: 80 mL/min/{1.73_m2} (ref >59)
Glucose: 78 mg/dL (ref 65–99)
Phosphorus: 2.8 mg/dL — ABNORMAL LOW (ref 3.0–4.3)
Potassium: 4.5 mmol/L (ref 3.5–5.2)
Sodium: 139 mmol/L (ref 134–144)

## 2018-07-01 LAB — SEDIMENTATION RATE: Sed Rate: 7 mm/hr (ref 0–32)

## 2018-07-01 LAB — LIPID PANEL
Chol/HDL Ratio: 4.8 ratio — ABNORMAL HIGH (ref 0.0–4.4)
Cholesterol, Total: 173 mg/dL (ref 100–199)
HDL: 36 mg/dL — ABNORMAL LOW (ref >39)
LDL Calculated: 123 mg/dL — ABNORMAL HIGH (ref 0–99)
Triglycerides: 72 mg/dL (ref 0–149)
VLDL Cholesterol Cal: 14 mg/dL (ref 5–40)

## 2018-07-01 LAB — TSH: TSH: 3.11 u[IU]/mL (ref 0.450–4.500)

## 2018-07-04 ENCOUNTER — Other Ambulatory Visit: Payer: Self-pay

## 2018-07-04 DIAGNOSIS — R5383 Other fatigue: Secondary | ICD-10-CM

## 2018-07-04 DIAGNOSIS — E034 Atrophy of thyroid (acquired): Secondary | ICD-10-CM

## 2018-07-04 MED ORDER — LEVOTHYROXINE SODIUM 25 MCG PO TABS
25.0000 ug | ORAL_TABLET | Freq: Every day | ORAL | 1 refills | Status: DC
Start: 2018-07-04 — End: 2018-08-11

## 2018-08-11 ENCOUNTER — Other Ambulatory Visit: Payer: Self-pay

## 2018-08-11 ENCOUNTER — Encounter: Payer: Self-pay | Admitting: Family Medicine

## 2018-08-11 ENCOUNTER — Ambulatory Visit: Payer: BC Managed Care – PPO | Admitting: Family Medicine

## 2018-08-11 VITALS — BP 110/64 | HR 72 | Ht 66.5 in | Wt 200.0 lb

## 2018-08-11 DIAGNOSIS — E034 Atrophy of thyroid (acquired): Secondary | ICD-10-CM

## 2018-08-11 DIAGNOSIS — F330 Major depressive disorder, recurrent, mild: Secondary | ICD-10-CM | POA: Diagnosis not present

## 2018-08-11 DIAGNOSIS — R5383 Other fatigue: Secondary | ICD-10-CM

## 2018-08-11 MED ORDER — FLUOXETINE HCL 20 MG PO TABS: 20.0000 mg | ORAL_TABLET | Freq: Every day | ORAL | 1 refills | Status: DC

## 2018-08-11 MED ORDER — LEVOTHYROXINE SODIUM 25 MCG PO TABS: 25.0000 ug | ORAL_TABLET | Freq: Every day | ORAL | 1 refills | Status: DC

## 2018-08-11 NOTE — Progress Notes (Signed)
Date:  08/11/2018   Name:  Emily Sawyer   DOB:  1975/07/18   MRN:  [id number]   Chief Complaint: Depression (follow up- feels like has more energy- PHQ9= down from a 7 to a 4)  Depression        This is a new problem.  The current episode started more than 1 year ago.   The onset quality is gradual.   The problem occurs intermittently.  The problem has been gradually improving since onset.  Associated symptoms include fatigue.  Associated symptoms include no decreased concentration, no helplessness, no hopelessness, does not have insomnia, not irritable, no restlessness, no decreased interest, no appetite change, no body aches, no myalgias, no headaches, no indigestion, not sad and no suicidal ideas.     The symptoms are aggravated by work stress.  Past treatments include SSRIs - Selective serotonin reuptake inhibitors.  Compliance with treatment is good.  Previous treatment provided moderate relief.  Risk factors: covid.    Review of Systems  Constitutional: Positive for fatigue. Negative for appetite change, chills, fever and unexpected weight change.  HENT: Negative for congestion, ear discharge, ear pain, rhinorrhea, sinus pressure, sneezing and sore throat.   Eyes: Negative for photophobia, pain, discharge, redness and itching.  Respiratory: Negative for cough, shortness of breath, wheezing and stridor.   Gastrointestinal: Negative for abdominal pain, blood in stool, constipation, diarrhea, nausea and vomiting.  Endocrine: Negative for cold intolerance, heat intolerance, polydipsia, polyphagia and polyuria.  Genitourinary: Negative for dysuria, flank pain, frequency, hematuria, menstrual problem, pelvic pain, urgency, vaginal bleeding and vaginal discharge.  Musculoskeletal: Negative for arthralgias, back pain and myalgias.  Skin: Negative for rash.  Allergic/Immunologic: Negative for environmental allergies and food allergies.  Neurological: Negative for dizziness, weakness,  light-headedness, numbness and headaches.  Hematological: Negative for adenopathy. Does not bruise/bleed easily.  Psychiatric/Behavioral: Positive for depression. Negative for decreased concentration, dysphoric mood and suicidal ideas. The patient is not nervous/anxious and does not have insomnia.     Patient Active Problem List   Diagnosis Date Noted  . Thyroid activity decreased 03/01/2018  . H/O total hysterectomy with removal of both tubes and ovaries 02/10/2017  . Dysmenorrhea 09/29/2016  . Menorrhagia with regular cycle 09/29/2016  . Endometriosis 09/20/2016  . Endometrioma of ovary 09/20/2016    Allergies  Allergen Reactions  . Codeine   . Other     dermabond-hives  . Latex Itching    Past Surgical History:  Procedure Laterality Date  . ABDOMINAL HYSTERECTOMY    . GALLBLADDER SURGERY    . LAPAROSCOPY      Social History   Tobacco Use  . Smoking status: Never Smoker  . Smokeless tobacco: Never Used  Substance Use Topics  . Alcohol use: Yes    Alcohol/week: 0.0 standard drinks  . Drug use: No     Medication list has been reviewed and updated.  Current Meds  Medication Sig  . estrogens-methylTEST (ESTRATEST) 1.25-2.5 MG tablet Take 2 tablets by mouth daily. Psychologist, sport and exercise  . FLUoxetine (PROZAC) 10 MG tablet Take 1 tablet (10 mg total) by mouth daily.  Marland Kitchen levothyroxine (SYNTHROID) 25 MCG tablet Take 1 tablet (25 mcg total) by mouth daily before breakfast.  . Multiple Vitamins-Minerals (MULTIVITAMIN GUMMIES ADULT) CHEW Chew 2 each by mouth daily. otc  . triamcinolone cream (KENALOG) 0.1 % Apply 1 application topically 2 (two) times daily.    PHQ 2/9 Scores 08/11/2018 06/30/2018 05/19/2018 01/28/2014  PHQ - 2 Score 0 2 0 0  PHQ- 9 Score 4 7 4  -    BP Readings from Last 3 Encounters:  08/11/18 110/64  06/30/18 110/60  05/19/18 110/72    Physical Exam Vitals signs and nursing note reviewed.  Constitutional:      General: She is not irritable.    Appearance: She  is well-developed and normal weight.  HENT:     Head: Normocephalic.     Right Ear: Tympanic membrane, ear canal and external ear normal.     Left Ear: Tympanic membrane, ear canal and external ear normal.     Nose: Nose normal. No congestion or rhinorrhea.     Mouth/Throat:     Mouth: Mucous membranes are moist.  Eyes:     General: Lids are everted, no foreign bodies appreciated. No scleral icterus.       Left eye: No foreign body or hordeolum.     Conjunctiva/sclera: Conjunctivae normal.     Right eye: Right conjunctiva is not injected.     Left eye: Left conjunctiva is not injected.     Pupils: Pupils are equal, round, and reactive to light.  Neck:     Musculoskeletal: Normal range of motion and neck supple.     Thyroid: No thyromegaly.     Vascular: No JVD.     Trachea: No tracheal deviation.  Cardiovascular:     Rate and Rhythm: Normal rate and regular rhythm.     Heart sounds: Normal heart sounds. No murmur. No friction rub. No gallop.   Pulmonary:     Effort: Pulmonary effort is normal. No respiratory distress.     Breath sounds: Normal breath sounds. No wheezing or rales.  Abdominal:     General: Bowel sounds are normal.     Palpations: Abdomen is soft. There is no mass.     Tenderness: There is no abdominal tenderness. There is no guarding or rebound.  Musculoskeletal: Normal range of motion.        General: No tenderness.  Lymphadenopathy:     Cervical: No cervical adenopathy.  Skin:    General: Skin is warm.     Findings: No rash.  Neurological:     Mental Status: She is alert and oriented to person, place, and time.     Cranial Nerves: No cranial nerve deficit.     Deep Tendon Reflexes: Reflexes normal.  Psychiatric:        Mood and Affect: Mood is not anxious or depressed.     Wt Readings from Last 3 Encounters:  08/11/18 200 lb (90.7 kg)  06/30/18 204 lb (92.5 kg)  05/19/18 203 lb (92.1 kg)    BP 110/64   Pulse 72   Ht 5' 6.5" (1.689 m)   Wt 200 lb  (90.7 kg)   LMP 08/22/2016   BMI 31.80 kg/m   Assessment and Plan: 1. Fatigue, unspecified type Chronic.  Improved.  She has a history of hypothyroidism and she will continue levothyroxine 25 mcg and fluoxetine will be increased to 20 mg. - FLUoxetine (PROZAC) 20 MG tablet; Take 1 tablet (20 mg total) by mouth daily.  Dispense: 90 tablet; Refill: 1 - levothyroxine (SYNTHROID) 25 MCG tablet; Take 1 tablet (25 mcg total) by mouth daily before breakfast.  Dispense: 90 tablet; Refill: 1  2. Hypothyroidism due to acquired atrophy of thyroid Chronic.  Controlled.  Continue levothyroxine 25 mg once a day.  Check TSH on next visit - levothyroxine (SYNTHROID) 25 MCG tablet; Take 1 tablet (25 mcg total) by  mouth daily before breakfast.  Dispense: 90 tablet; Refill: 1  3. Mild episode of recurrent major depressive disorder ([place]) Noted PHQ went from 7-4.  Discussed with patient and even though she is improved she would like to push up on the from 10 to 20 mg. - FLUoxetine (PROZAC) 20 MG tablet; Take 1 tablet (20 mg total) by mouth daily.  Dispense: 90 tablet; Refill: 1

## 2018-08-22 ENCOUNTER — Ambulatory Visit: Payer: BC Managed Care – PPO | Admitting: Family Medicine

## 2018-08-22 ENCOUNTER — Other Ambulatory Visit: Payer: Self-pay

## 2018-08-22 ENCOUNTER — Encounter: Payer: Self-pay | Admitting: Family Medicine

## 2018-08-22 VITALS — BP 120/60 | HR 64 | Ht 66.0 in | Wt 201.0 lb

## 2018-08-22 DIAGNOSIS — L03116 Cellulitis of left lower limb: Secondary | ICD-10-CM

## 2018-08-22 DIAGNOSIS — B379 Candidiasis, unspecified: Secondary | ICD-10-CM | POA: Diagnosis not present

## 2018-08-22 MED ORDER — FLUCONAZOLE 150 MG PO TABS: 150.0000 mg | ORAL_TABLET | Freq: Once | ORAL | 0 refills | Status: AC

## 2018-08-22 MED ORDER — MUPIROCIN 2 % EX OINT: 1.0000 "application " | TOPICAL_OINTMENT | Freq: Two times a day (BID) | CUTANEOUS | 0 refills | Status: AC

## 2018-08-22 MED ORDER — DOXYCYCLINE HYCLATE 100 MG PO TABS: 100.0000 mg | ORAL_TABLET | Freq: Two times a day (BID) | ORAL | 0 refills | Status: DC

## 2018-08-22 NOTE — Progress Notes (Signed)
Date:  08/22/2018   Name:  Emily Sawyer   DOB:  [birth year]   MRN:  [id number]   Chief Complaint: Rash (on L) leg- used triamcinolone cream- made it worse)  Rash This is a new problem. The current episode started in the past 7 days (onset Friday). The problem has been gradually worsening since onset. The affected locations include the left lower leg. The rash is characterized by redness. She was exposed to an insect bite/sting. Pertinent negatives include no anorexia, congestion, cough, diarrhea, facial edema, fatigue, fever, joint pain, rhinorrhea, shortness of breath, sore throat or vomiting. Past treatments include nothing. The treatment provided mild relief.    Review of Systems  Constitutional: Negative for chills, fatigue and fever.  HENT: Negative for congestion, drooling, ear discharge, ear pain, rhinorrhea and sore throat.   Respiratory: Negative for cough, shortness of breath and wheezing.   Cardiovascular: Negative for chest pain, palpitations and leg swelling.  Gastrointestinal: Negative for abdominal pain, anorexia, blood in stool, constipation, diarrhea, nausea and vomiting.  Endocrine: Negative for polydipsia.  Genitourinary: Negative for dysuria, frequency, hematuria and urgency.  Musculoskeletal: Negative for back pain, joint pain, myalgias and neck pain.  Skin: Positive for rash.  Allergic/Immunologic: Negative for environmental allergies.  Neurological: Negative for dizziness and headaches.  Hematological: Does not bruise/bleed easily.  Psychiatric/Behavioral: Negative for suicidal ideas. The patient is not nervous/anxious.     Patient Active Problem List   Diagnosis Date Noted  . Thyroid activity decreased 03/01/2018  . H/O total hysterectomy with removal of both tubes and ovaries 02/10/2017  . Dysmenorrhea 09/29/2016  . Menorrhagia with regular cycle 09/29/2016  . Endometriosis 09/20/2016  . Endometrioma of ovary 09/20/2016    Allergies  Allergen Reactions   . Codeine   . Other     dermabond-hives  . Latex Itching    Past Surgical History:  Procedure Laterality Date  . ABDOMINAL HYSTERECTOMY    . GALLBLADDER SURGERY    . LAPAROSCOPY      Social History   Tobacco Use  . Smoking status: Never Smoker  . Smokeless tobacco: Never Used  Substance Use Topics  . Alcohol use: Yes    Alcohol/week: 0.0 standard drinks  . Drug use: No     Medication list has been reviewed and updated.  Current Meds  Medication Sig  . estrogens-methylTEST (ESTRATEST) 1.25-2.5 MG tablet Take 2 tablets by mouth daily. Psychologist, sport and exercise  . FLUoxetine (PROZAC) 20 MG tablet Take 1 tablet (20 mg total) by mouth daily.  Marland Kitchen levothyroxine (SYNTHROID) 25 MCG tablet Take 1 tablet (25 mcg total) by mouth daily before breakfast.  . Multiple Vitamins-Minerals (MULTIVITAMIN GUMMIES ADULT) CHEW Chew 2 each by mouth daily. otc    PHQ 2/9 Scores 08/11/2018 06/30/2018 05/19/2018 01/28/2014  PHQ - 2 Score 0 2 0 0  PHQ- 9 Score 4 7 4  -    BP Readings from Last 3 Encounters:  08/22/18 120/60  08/11/18 110/64  06/30/18 110/60    Physical Exam Vitals signs and nursing note reviewed.  HENT:     Head: Normocephalic.  Cardiovascular:     Pulses: Normal pulses.     Heart sounds: Normal heart sounds. No murmur. No gallop.   Pulmonary:     Effort: Pulmonary effort is normal.     Breath sounds: Normal breath sounds. No wheezing or rhonchi.  Skin:    General: Skin is warm.     Findings: Erythema present.     Wt Readings from Last  3 Encounters:  08/22/18 201 lb (91.2 kg)  08/11/18 200 lb (90.7 kg)  06/30/18 204 lb (92.5 kg)    BP 120/60   Pulse 64   Ht 5\' 6"  (1.676 m)   Wt 201 lb (91.2 kg)   LMP 08/22/2016   BMI 32.44 kg/m   Assessment and Plan:  1. Cellulitis of left lower extremity Examined a area of the left lower leg under the microscope that was consistent with a crusting and not an insect.  Patient developed a cellulitic area after probably some type of insect  bite.  Will initiate doxycycline 100 mg 1 twice a day Bactroban ointment applied twice a day - doxycycline (VIBRA-TABS) 100 MG tablet; Take 1 tablet (100 mg total) by mouth 2 (two) times daily.  Dispense: 14 tablet; Refill: 0 - mupirocin ointment (BACTROBAN) 2 %; Apply 1 application topically 2 (two) times daily.  Dispense: 22 g; Refill: 0  2. Candida infection Patient gets candidiasis infection with use of antibiotics and will provide a Diflucan tablet on the preemptive basis. - fluconazole (DIFLUCAN) 150 MG tablet; Take 1 tablet (150 mg total) by mouth once for 1 dose.  Dispense: 1 tablet; Refill: 0

## 2019-02-02 ENCOUNTER — Other Ambulatory Visit: Payer: Self-pay

## 2019-02-02 DIAGNOSIS — R5383 Other fatigue: Secondary | ICD-10-CM

## 2019-02-02 DIAGNOSIS — F330 Major depressive disorder, recurrent, mild: Secondary | ICD-10-CM

## 2019-02-02 MED ORDER — FLUOXETINE HCL 20 MG PO TABS: 20.0000 mg | ORAL_TABLET | Freq: Every day | ORAL | 0 refills | Status: DC

## 2019-02-07 ENCOUNTER — Ambulatory Visit: Payer: BC Managed Care – PPO | Admitting: Family Medicine

## 2019-02-07 ENCOUNTER — Other Ambulatory Visit: Payer: Self-pay

## 2019-02-07 ENCOUNTER — Encounter: Payer: Self-pay | Admitting: Family Medicine

## 2019-02-07 VITALS — BP 120/78 | HR 80 | Ht 66.0 in | Wt 205.0 lb

## 2019-02-07 DIAGNOSIS — F4323 Adjustment disorder with mixed anxiety and depressed mood: Secondary | ICD-10-CM

## 2019-02-07 DIAGNOSIS — R5383 Other fatigue: Secondary | ICD-10-CM | POA: Diagnosis not present

## 2019-02-07 DIAGNOSIS — E034 Atrophy of thyroid (acquired): Secondary | ICD-10-CM

## 2019-02-07 MED ORDER — LEVOTHYROXINE SODIUM 25 MCG PO TABS: 25.0000 ug | ORAL_TABLET | Freq: Every day | ORAL | 1 refills | Status: DC

## 2019-02-07 MED ORDER — FLUOXETINE HCL 20 MG PO TABS: 20.0000 mg | ORAL_TABLET | Freq: Every day | ORAL | 1 refills | Status: DC

## 2019-02-07 NOTE — Patient Instructions (Signed)

## 2019-02-07 NOTE — Progress Notes (Signed)
Date:  02/07/2019   Name:  Emily Sawyer   DOB:  [birth year]   MRN:  [id number]   Chief Complaint: Hypothyroidism and Anxiety (PHQ9=2 and GAD7=3)  Thyroid Problem Presents for follow-up visit. Symptoms include anxiety, cold intolerance, dry skin and fatigue. Patient reports no constipation, depressed mood, diaphoresis, diarrhea, hair loss, heat intolerance, menstrual problem, nail problem, palpitations, weight gain or weight loss. The symptoms have been stable.  Depression        This is a chronic problem.  The current episode started more than 1 year ago.   The onset quality is sudden.   The problem has been gradually improving since onset.  Associated symptoms include fatigue.  Associated symptoms include no decreased concentration, no helplessness, no hopelessness, does not have insomnia, not irritable, no restlessness, no decreased interest, no appetite change, no myalgias, no headaches, no indigestion, not sad and no suicidal ideas.  Past treatments include SSRIs - Selective serotonin reuptake inhibitors.  Past medical history includes thyroid problem and anxiety.   Anxiety Presents for follow-up visit. Symptoms include nervous/anxious behavior. Patient reports no chest pain, compulsions, confusion, decreased concentration, depressed mood, dizziness, dry mouth, excessive worry, feeling of choking, hyperventilation, impotence, insomnia, irritability, malaise, muscle tension, nausea, obsessions, palpitations, panic, restlessness, shortness of breath or suicidal ideas.      Lab Results  Component Value Date   CREATININE 0.89 06/30/2018   BUN 9 06/30/2018   NA 139 06/30/2018   K 4.5 06/30/2018   CL 103 06/30/2018   CO2 23 06/30/2018   Lab Results  Component Value Date   CHOL 173 06/30/2018   HDL 36 (L) 06/30/2018   LDLCALC 123 (H) 06/30/2018   TRIG 72 06/30/2018   CHOLHDL 4.8 (H) 06/30/2018   Lab Results  Component Value Date   TSH 3.110 06/30/2018   No results found for: HGBA1C    Review of Systems  Constitutional: Positive for fatigue. Negative for appetite change, chills, diaphoresis, fever, irritability, weight gain and weight loss.  HENT: Negative for drooling, ear discharge, ear pain and sore throat.   Respiratory: Negative for cough, shortness of breath and wheezing.   Cardiovascular: Negative for chest pain, palpitations and leg swelling.  Gastrointestinal: Negative for abdominal pain, blood in stool, constipation, diarrhea and nausea.  Endocrine: Positive for cold intolerance. Negative for heat intolerance and polydipsia.  Genitourinary: Negative for dysuria, frequency, hematuria, impotence, menstrual problem and urgency.  Musculoskeletal: Negative for back pain, myalgias and neck pain.  Skin: Negative for rash.  Allergic/Immunologic: Negative for environmental allergies.  Neurological: Negative for dizziness and headaches.  Hematological: Does not bruise/bleed easily.  Psychiatric/Behavioral: Positive for depression. Negative for confusion, decreased concentration and suicidal ideas. The patient is nervous/anxious. The patient does not have insomnia.     Patient Active Problem List   Diagnosis Date Noted  . Thyroid activity decreased 03/01/2018  . H/O total hysterectomy with removal of both tubes and ovaries 02/10/2017  . Dysmenorrhea 09/29/2016  . Menorrhagia with regular cycle 09/29/2016  . Endometriosis 09/20/2016  . Endometrioma of ovary 09/20/2016    Allergies  Allergen Reactions  . Codeine   . Other     dermabond-hives  . Latex Itching    Past Surgical History:  Procedure Laterality Date  . ABDOMINAL HYSTERECTOMY    . GALLBLADDER SURGERY    . LAPAROSCOPY      Social History   Tobacco Use  . Smoking status: Never Smoker  . Smokeless tobacco: Never Used  Substance Use  Topics  . Alcohol use: Yes    Alcohol/week: 0.0 standard drinks  . Drug use: No     Medication list has been reviewed and updated.  Current Meds   Medication Sig  . estrogens-methylTEST (ESTRATEST) 1.25-2.5 MG tablet Take 2 tablets by mouth daily. Psychologist, sport and exercise  . FLUoxetine (PROZAC) 20 MG tablet Take 1 tablet (20 mg total) by mouth daily.  Marland Kitchen levothyroxine (SYNTHROID) 25 MCG tablet Take 1 tablet (25 mcg total) by mouth daily before breakfast.  . Multiple Vitamins-Minerals (MULTIVITAMIN GUMMIES ADULT) CHEW Chew 2 each by mouth daily. otc  . mupirocin ointment (BACTROBAN) 2 % Apply 1 application topically 2 (two) times daily.  Marland Kitchen triamcinolone cream (KENALOG) 0.1 % Apply 1 application topically 2 (two) times daily.    PHQ 2/9 Scores 02/07/2019 08/11/2018 06/30/2018 05/19/2018  PHQ - 2 Score 0 0 2 0  PHQ- 9 Score 2 4 7 4     BP Readings from Last 3 Encounters:  02/07/19 120/78  08/22/18 120/60  08/11/18 110/64    Physical Exam Vitals signs and nursing note reviewed.  Constitutional:      General: She is not irritable.She is not in acute distress.    Appearance: She is not diaphoretic.  HENT:     Head: Normocephalic and atraumatic.     Right Ear: External ear normal.     Left Ear: External ear normal.     Nose: Nose normal.  Eyes:     General:        Right eye: No discharge.        Left eye: No discharge.     Conjunctiva/sclera: Conjunctivae normal.     Pupils: Pupils are equal, round, and reactive to light.  Neck:     Musculoskeletal: Normal range of motion and neck supple.     Thyroid: No thyromegaly.     Vascular: No JVD.  Cardiovascular:     Rate and Rhythm: Normal rate and regular rhythm.     Heart sounds: Normal heart sounds. No murmur. No friction rub. No gallop.   Pulmonary:     Effort: Pulmonary effort is normal.     Breath sounds: Normal breath sounds.  Abdominal:     General: Bowel sounds are normal.     Palpations: Abdomen is soft. There is no mass.     Tenderness: There is no abdominal tenderness. There is no guarding.  Musculoskeletal: Normal range of motion.  Lymphadenopathy:     Cervical: No cervical  adenopathy.  Skin:    General: Skin is warm and dry.  Neurological:     Mental Status: She is alert.     Deep Tendon Reflexes: Reflexes are normal and symmetric.     Wt Readings from Last 3 Encounters:  02/07/19 205 lb (93 kg)  08/22/18 201 lb (91.2 kg)  08/11/18 200 lb (90.7 kg)    BP 120/78   Pulse 80   Ht 5\' 6"  (1.676 m)   Wt 205 lb (93 kg)   LMP 08/22/2016   BMI 33.09 kg/m   Assessment and Plan: 1. Hypothyroidism due to acquired atrophy of thyroid Chronic.  Controlled.  Stable.  Patient doing well on current dosing of levothyroxine 25 mcg for which we will continue.  We will hold on doing TSH because patient is currently not as fatigued and symptoms are stable. - levothyroxine (SYNTHROID) 25 MCG tablet; Take 1 tablet (25 mcg total) by mouth daily before breakfast.  Dispense: 90 tablet; Refill: 1  2. Adjustment disorder  with mixed anxiety and depressed mood Chronic.  Controlled.  Stable.  PHQ score was 2 and gad score was 3 we will continue patient on fluoxetine 20 mg once a day.  Will recheck in 6 months  3. Fatigue, unspecified type Fatigue has improved on the thyroxine and we will continue at present dosing. - levothyroxine (SYNTHROID) 25 MCG tablet; Take 1 tablet (25 mcg total) by mouth daily before breakfast.  Dispense: 90 tablet; Refill: 1 - FLUoxetine (PROZAC) 20 MG tablet; Take 1 tablet (20 mg total) by mouth daily.  Dispense: 90 tablet; Refill: 1

## 2019-06-05 ENCOUNTER — Other Ambulatory Visit: Payer: Self-pay

## 2019-06-05 DIAGNOSIS — F4323 Adjustment disorder with mixed anxiety and depressed mood: Secondary | ICD-10-CM

## 2019-06-05 DIAGNOSIS — R5383 Other fatigue: Secondary | ICD-10-CM

## 2019-06-05 MED ORDER — FLUOXETINE HCL 20 MG PO TABS: 20.0000 mg | ORAL_TABLET | Freq: Every day | ORAL | 0 refills | Status: DC

## 2019-08-01 ENCOUNTER — Ambulatory Visit: Payer: BC Managed Care – PPO | Admitting: Family Medicine

## 2019-08-01 ENCOUNTER — Encounter: Payer: Self-pay | Admitting: Family Medicine

## 2019-08-01 ENCOUNTER — Other Ambulatory Visit: Payer: Self-pay

## 2019-08-01 VITALS — BP 120/62 | HR 64 | Ht 66.0 in | Wt 208.0 lb

## 2019-08-01 DIAGNOSIS — R5383 Other fatigue: Secondary | ICD-10-CM | POA: Diagnosis not present

## 2019-08-01 DIAGNOSIS — F4323 Adjustment disorder with mixed anxiety and depressed mood: Secondary | ICD-10-CM | POA: Diagnosis not present

## 2019-08-01 DIAGNOSIS — E034 Atrophy of thyroid (acquired): Secondary | ICD-10-CM

## 2019-08-01 DIAGNOSIS — R635 Abnormal weight gain: Secondary | ICD-10-CM

## 2019-08-01 DIAGNOSIS — J301 Allergic rhinitis due to pollen: Secondary | ICD-10-CM | POA: Diagnosis not present

## 2019-08-01 MED ORDER — FLUOXETINE HCL 20 MG PO TABS: 20.0000 mg | ORAL_TABLET | Freq: Every day | ORAL | 0 refills | Status: DC

## 2019-08-01 MED ORDER — MONTELUKAST SODIUM 10 MG PO TABS: 10.0000 mg | ORAL_TABLET | Freq: Every day | ORAL | 3 refills | Status: DC

## 2019-08-01 MED ORDER — LEVOTHYROXINE SODIUM 25 MCG PO TABS: 25.0000 ug | ORAL_TABLET | Freq: Every day | ORAL | 1 refills | Status: DC

## 2019-08-01 MED ORDER — FLUOXETINE HCL 10 MG PO CAPS: 10.0000 mg | ORAL_CAPSULE | Freq: Every day | ORAL | 1 refills | Status: DC

## 2019-08-01 NOTE — Progress Notes (Signed)
Date:  08/01/2019   Name:  Emily Sawyer   DOB:  [birth year]   MRN:  [id number]   Chief Complaint: Hypothyroidism, Depression (phQ9=7 and GAD7=6), and Allergic Rhinitis  (wants referral to eNT for allergy testing)  Depression        This is a chronic problem.  The current episode started more than 1 year ago. The problem is unchanged (plateau).  Associated symptoms include decreased concentration, fatigue, insomnia, irritable and restlessness.  Associated symptoms include no helplessness, no hopelessness, no decreased interest, no appetite change, no body aches, no myalgias, no headaches, no indigestion, not sad and no suicidal ideas.     The symptoms are aggravated by work stress (covid).  Past treatments include SSRIs - Selective serotonin reuptake inhibitors.  Compliance with treatment is good.  Previous treatment provided mild relief.  Past medical history includes thyroid problem and anxiety.   Anxiety Presents for follow-up visit. Symptoms include decreased concentration, depressed mood, excessive worry, insomnia, irritability, nervous/anxious behavior and restlessness. Patient reports no chest pain, compulsions, confusion, dizziness, dry mouth, feeling of choking, hyperventilation, impotence, malaise, muscle tension, nausea, obsessions, palpitations, panic, shortness of breath or suicidal ideas.    Thyroid Problem Presents for follow-up visit. Symptoms include anxiety, depressed mood, diaphoresis, dry skin, fatigue, heat intolerance and weight gain. Patient reports no cold intolerance, constipation, diarrhea, hair loss, hoarse voice, leg swelling, menstrual problem, nail problem, palpitations, tremors, visual change or weight loss. The symptoms have been worsening.  URI  This is a recurrent (for allergies) problem. The current episode started more than 1 year ago. The problem has been waxing and waning. There has been no fever. Pertinent negatives include no abdominal pain, chest pain,  congestion, coughing, diarrhea, dysuria, ear pain, headaches, nausea, rash, rhinorrhea, sneezing, sore throat, vomiting or wheezing. She has tried antihistamine (flonase) for the symptoms.    Lab Results  Component Value Date   CREATININE 0.89 06/30/2018   BUN 9 06/30/2018   NA 139 06/30/2018   K 4.5 06/30/2018   CL 103 06/30/2018   CO2 23 06/30/2018   Lab Results  Component Value Date   CHOL 173 06/30/2018   HDL 36 (L) 06/30/2018   LDLCALC 123 (H) 06/30/2018   TRIG 72 06/30/2018   CHOLHDL 4.8 (H) 06/30/2018   Lab Results  Component Value Date   TSH 3.110 06/30/2018   No results found for: HGBA1C Lab Results  Component Value Date   WBC 7.0 06/30/2018   HGB 14.3 06/30/2018   HCT 42.1 06/30/2018   MCV 92 06/30/2018   PLT 278 06/30/2018   Lab Results  Component Value Date   ALT 15 08/07/2013   AST 24 08/07/2013   ALKPHOS 52 08/07/2013   BILITOT 0.8 08/07/2013     Review of Systems  Constitutional: Positive for diaphoresis, fatigue, irritability and weight gain. Negative for appetite change, chills, fever, unexpected weight change and weight loss.  HENT: Negative for congestion, ear discharge, ear pain, hoarse voice, rhinorrhea, sinus pressure, sneezing and sore throat.   Eyes: Negative for photophobia, pain, discharge, redness and itching.  Respiratory: Negative for cough, shortness of breath, wheezing and stridor.   Cardiovascular: Negative for chest pain and palpitations.  Gastrointestinal: Negative for abdominal pain, blood in stool, constipation, diarrhea, nausea and vomiting.  Endocrine: Positive for heat intolerance. Negative for cold intolerance, polydipsia, polyphagia and polyuria.  Genitourinary: Negative for dysuria, flank pain, frequency, hematuria, impotence, menstrual problem, pelvic pain, urgency, vaginal bleeding and vaginal discharge.  Musculoskeletal:  Negative for arthralgias, back pain and myalgias.  Skin: Negative for rash.  Allergic/Immunologic:  Negative for environmental allergies and food allergies.  Neurological: Negative for dizziness, tremors, weakness, light-headedness, numbness and headaches.  Hematological: Negative for adenopathy. Does not bruise/bleed easily.  Psychiatric/Behavioral: Positive for decreased concentration and depression. Negative for confusion, dysphoric mood and suicidal ideas. The patient is nervous/anxious and has insomnia.     Patient Active Problem List   Diagnosis Date Noted  . Thyroid activity decreased 03/01/2018  . H/O total hysterectomy with removal of both tubes and ovaries 02/10/2017  . Dysmenorrhea 09/29/2016  . Menorrhagia with regular cycle 09/29/2016  . Endometriosis 09/20/2016  . Endometrioma of ovary 09/20/2016    Allergies  Allergen Reactions  . Codeine   . Other     dermabond-hives  . Latex Itching    Past Surgical History:  Procedure Laterality Date  . ABDOMINAL HYSTERECTOMY    . GALLBLADDER SURGERY    . LAPAROSCOPY      Social History   Tobacco Use  . Smoking status: Never Smoker  . Smokeless tobacco: Never Used  Substance Use Topics  . Alcohol use: Yes    Alcohol/week: 0.0 standard drinks  . Drug use: No     Medication list has been reviewed and updated.  Current Meds  Medication Sig  . estrogens-methylTEST (ESTRATEST) 1.25-2.5 MG tablet Take 2 tablets by mouth daily. Psychologist, sport and exercise  . FLUoxetine (PROZAC) 20 MG tablet Take 1 tablet (20 mg total) by mouth daily.  Marland Kitchen levothyroxine (SYNTHROID) 25 MCG tablet Take 1 tablet (25 mcg total) by mouth daily before breakfast.  . loratadine (CLARITIN) 10 MG tablet Take 10 mg by mouth daily. otc  . Multiple Vitamins-Minerals (MULTIVITAMIN GUMMIES ADULT) CHEW Chew 2 each by mouth daily. otc    PHQ 2/9 Scores 08/01/2019 02/07/2019 08/11/2018 06/30/2018  PHQ - 2 Score 0 0 0 2  PHQ- 9 Score 7 2 4 7     BP Readings from Last 3 Encounters:  08/01/19 120/62  02/07/19 120/78  08/22/18 120/60    Physical Exam Vitals and nursing  note reviewed.  Constitutional:      General: She is irritable.     Appearance: She is well-developed.  HENT:     Head: Normocephalic.     Right Ear: Tympanic membrane, ear canal and external ear normal.     Left Ear: Tympanic membrane, ear canal and external ear normal.     Nose: Nose normal. No congestion or rhinorrhea.     Mouth/Throat:     Mouth: Mucous membranes are moist.  Eyes:     General: Lids are everted, no foreign bodies appreciated. No scleral icterus.       Left eye: No foreign body or hordeolum.     Conjunctiva/sclera: Conjunctivae normal.     Right eye: Right conjunctiva is not injected.     Left eye: Left conjunctiva is not injected.     Pupils: Pupils are equal, round, and reactive to light.  Neck:     Thyroid: No thyromegaly.     Vascular: No JVD.     Trachea: No tracheal deviation.  Cardiovascular:     Rate and Rhythm: Normal rate and regular rhythm.     Heart sounds: Normal heart sounds. No murmur. No friction rub. No gallop.   Pulmonary:     Effort: Pulmonary effort is normal. No respiratory distress.     Breath sounds: Normal breath sounds. No wheezing, rhonchi or rales.  Abdominal:  General: Bowel sounds are normal.     Palpations: Abdomen is soft. There is no mass.     Tenderness: There is no abdominal tenderness. There is no guarding or rebound.  Musculoskeletal:        General: No tenderness. Normal range of motion.     Cervical back: Normal range of motion and neck supple.  Lymphadenopathy:     Cervical: No cervical adenopathy.  Skin:    General: Skin is warm.     Findings: No rash.  Neurological:     Mental Status: She is alert and oriented to person, place, and time.     Cranial Nerves: No cranial nerve deficit.     Deep Tendon Reflexes: Reflexes normal.  Psychiatric:        Mood and Affect: Mood is not anxious or depressed.     Wt Readings from Last 3 Encounters:  08/01/19 208 lb (94.3 kg)  02/07/19 205 lb (93 kg)  08/22/18 201 lb  (91.2 kg)    BP 120/62   Pulse 64   Ht 5\' 6"  (1.676 m)   Wt 208 lb (94.3 kg)   LMP 08/22/2016   BMI 33.57 kg/m   Assessment and Plan: 1. Hypothyroidism due to acquired atrophy of thyroid Chronic.  Controlled.  Stable.  Patient is having still some fatigue at this time.  We will check a thyroid panel with TSH and although we have refilled her Synthroid at 25 mcg we are holding for TSH to see if we need to increase dosing. - Thyroid Panel With an increase in her Prozac. - levothyroxine (SYNTHROID) 25 MCG tablet; Take 1 tablet (25 mcg total) by mouth daily before breakfast.  Dispense: 90 tablet; Refill: 1  2. Adjustment disorder with mixed anxiety and depressed mood Chronic.  Partially controlled.  Relatively stable.  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  We will increase the fluoxetine to 30 mg which would be a 20 and a 10 may be taking at the same time we will recheck with patient in 6 months. - FLUoxetine (PROZAC) 10 MG capsule; Take 1 capsule (10 mg total) by mouth daily.  Dispense: 90 capsule; Refill: 1 - FLUoxetine (PROZAC) 20 MG tablet; Take 1 tablet (20 mg total) by mouth daily.  Dispense: 90 tablet; Refill: 0  3. Seasonal allergic rhinitis due to pollen Patient has been having increasing allergies since getting a dog but this seems to be more seasonal and she is concerned more about knowing her pollen allergies and possibly desensitization.  Patient will continue her Claritin and her Flonase and I will also add some Singulair for leukotriene inhibitor inhibition.  We will refer to Dr. Kristine Garbe ENT for possibility of allergy testing and possible desensitization thereafter. - montelukast (SINGULAIR) 10 MG tablet; Take 1 tablet (10 mg total) by mouth at bedtime.  Dispense: 30 tablet; Refill: 3 - Ambulatory referral to ENT  4. Fatigue,  unspecified type Chronic.  Off and on.  This may be due to her thyroid versus mixed anxiety depression.  We will be checking - FLUoxetine (PROZAC) 20 MG tablet; Take 1 tablet (20 mg total) by mouth daily.  Dispense: 90 tablet; Refill: 0 - levothyroxine (SYNTHROID) 25 MCG tablet; Take 1 tablet (25 mcg total) by mouth daily before breakfast.  Dispense: 90 tablet; Refill: 1  5. Weight gain Patient  relates that she has had some weight gain and we will be checking a lipid panel to see if there is a concern with her current lipid status. - Lipid Panel With LDL/HDL Ratio

## 2019-08-02 ENCOUNTER — Other Ambulatory Visit: Payer: Self-pay

## 2019-08-02 DIAGNOSIS — E782 Mixed hyperlipidemia: Secondary | ICD-10-CM

## 2019-08-02 LAB — THYROID PANEL WITH TSH
Free Thyroxine Index: 1.5 (ref 1.2–4.9)
T3 Uptake Ratio: 25 % (ref 24–39)
T4, Total: 5.9 ug/dL (ref 4.5–12.0)
TSH: 2.63 u[IU]/mL (ref 0.450–4.500)

## 2019-08-02 LAB — LIPID PANEL WITH LDL/HDL RATIO
Cholesterol, Total: 177 mg/dL (ref 100–199)
HDL: 36 mg/dL — ABNORMAL LOW (ref >39)
LDL Chol Calc (NIH): 126 mg/dL — ABNORMAL HIGH (ref 0–99)
LDL/HDL Ratio: 3.5 ratio — ABNORMAL HIGH (ref 0.0–3.2)
Triglycerides: 81 mg/dL (ref 0–149)
VLDL Cholesterol Cal: 15 mg/dL (ref 5–40)

## 2019-08-02 MED ORDER — ATORVASTATIN CALCIUM 10 MG PO TABS: 10.0000 mg | ORAL_TABLET | Freq: Every day | ORAL | 1 refills | Status: DC

## 2019-08-02 NOTE — Progress Notes (Unsigned)
atorv sent in

## 2019-09-24 ENCOUNTER — Other Ambulatory Visit: Payer: Self-pay | Admitting: Family Medicine

## 2019-09-24 DIAGNOSIS — E782 Mixed hyperlipidemia: Secondary | ICD-10-CM

## 2019-09-24 NOTE — Telephone Encounter (Addendum)
30 day courtesy RF Due for repeat lipid level Requested Prescriptions  Signed Prescriptions Disp Refills   atorvastatin (LIPITOR) 10 MG tablet 30 tablet 1    Sig: TAKE 1 TABLET(10 MG) BY MOUTH DAILY     Cardiovascular:  Antilipid - Statins Failed - 09/24/2019 12:04 PM      Failed - LDL in normal range and within 360 days    LDL Chol Calc (NIH)  Date Value Ref Range Status  08/01/2019 126 (H) 0 - 99 mg/dL Final         Failed - HDL in normal range and within 360 days    HDL  Date Value Ref Range Status  08/01/2019 36 (L) >39 mg/dL Final         Passed - Total Cholesterol in normal range and within 360 days    Cholesterol, Total  Date Value Ref Range Status  08/01/2019 177 100 - 199 mg/dL Final         Passed - Triglycerides in normal range and within 360 days    Triglycerides  Date Value Ref Range Status  08/01/2019 81 0 - 149 mg/dL Final         Passed - Patient is not pregnant      Passed - Valid encounter within last 12 months    Recent Outpatient Visits          1 month ago Hypothyroidism due to acquired atrophy of thyroid   [facility] Juline Patch, MD   7 months ago Hypothyroidism due to acquired atrophy of thyroid   [facility] Juline Patch, MD   1 year ago Cellulitis of left lower extremity   [facility] Juline Patch, MD   1 year ago Mild episode of recurrent major depressive disorder [facility])   [facility] Juline Patch, MD   1 year ago Fatigue, unspecified type   [facility] Juline Patch, MD

## 2019-11-18 ENCOUNTER — Other Ambulatory Visit: Payer: Self-pay | Admitting: Family Medicine

## 2019-11-18 DIAGNOSIS — E782 Mixed hyperlipidemia: Secondary | ICD-10-CM

## 2019-11-18 NOTE — Telephone Encounter (Signed)
Requested Prescriptions  Pending Prescriptions Disp Refills   atorvastatin (LIPITOR) 10 MG tablet [Pharmacy Med Name: ATORVASTATIN 10MG  TABLETS] 90 tablet 2    Sig: TAKE 1 TABLET(10 MG) BY MOUTH DAILY     Cardiovascular:  Antilipid - Statins Failed - 11/18/2019  7:53 AM      Failed - LDL in normal range and within 360 days    LDL Chol Calc (NIH)  Date Value Ref Range Status  08/01/2019 126 (H) 0 - 99 mg/dL Final         Failed - HDL in normal range and within 360 days    HDL  Date Value Ref Range Status  08/01/2019 36 (L) >39 mg/dL Final         Passed - Total Cholesterol in normal range and within 360 days    Cholesterol, Total  Date Value Ref Range Status  08/01/2019 177 100 - 199 mg/dL Final         Passed - Triglycerides in normal range and within 360 days    Triglycerides  Date Value Ref Range Status  08/01/2019 81 0 - 149 mg/dL Final         Passed - Patient is not pregnant      Passed - Valid encounter within last 12 months    Recent Outpatient Visits          3 months ago Hypothyroidism due to acquired atrophy of thyroid   [facility] Juline Patch, MD   9 months ago Hypothyroidism due to acquired atrophy of thyroid   [facility] Juline Patch, MD   1 year ago Cellulitis of left lower extremity   [facility] Juline Patch, MD   1 year ago Mild episode of recurrent major depressive disorder [facility])   [facility] Juline Patch, MD   1 year ago Fatigue, unspecified type   [facility] Juline Patch, MD

## 2020-01-22 ENCOUNTER — Other Ambulatory Visit: Payer: Self-pay | Admitting: Family Medicine

## 2020-01-22 DIAGNOSIS — R5383 Other fatigue: Secondary | ICD-10-CM

## 2020-01-22 DIAGNOSIS — F4323 Adjustment disorder with mixed anxiety and depressed mood: Secondary | ICD-10-CM

## 2020-01-22 DIAGNOSIS — E034 Atrophy of thyroid (acquired): Secondary | ICD-10-CM

## 2020-02-25 ENCOUNTER — Other Ambulatory Visit: Payer: Self-pay

## 2020-02-25 DIAGNOSIS — F330 Major depressive disorder, recurrent, mild: Secondary | ICD-10-CM

## 2020-02-25 DIAGNOSIS — R5383 Other fatigue: Secondary | ICD-10-CM

## 2020-02-25 MED ORDER — FLUOXETINE HCL 20 MG PO TABS: 20.0000 mg | ORAL_TABLET | Freq: Every day | ORAL | 0 refills | Status: DC

## 2020-04-10 ENCOUNTER — Telehealth: Payer: Self-pay

## 2020-04-10 NOTE — Telephone Encounter (Signed)
Copied from [place] [id number]. Topic: General - Other >> Apr 10, 2020  3:32 PM Leward Quan A wrote: Reason for CRM: Patient called in to inquire of Dr Ronnald Ramp how often she should be coming in for appointments. Not sure if its yearly every 3 or 6 months. Please advise Ph# [phone]

## 2020-04-10 NOTE — Telephone Encounter (Signed)
I called pt and left a message for her to call and schedule appt for this month/ med refills, as she is suppose to be seeing Dr Ronnald Ramp every 6 months

## 2020-04-15 ENCOUNTER — Other Ambulatory Visit: Payer: Self-pay

## 2020-04-15 ENCOUNTER — Ambulatory Visit: Payer: BC Managed Care – PPO | Admitting: Family Medicine

## 2020-04-15 ENCOUNTER — Encounter: Payer: Self-pay | Admitting: Family Medicine

## 2020-04-15 DIAGNOSIS — F330 Major depressive disorder, recurrent, mild: Secondary | ICD-10-CM | POA: Diagnosis not present

## 2020-04-15 DIAGNOSIS — E782 Mixed hyperlipidemia: Secondary | ICD-10-CM | POA: Diagnosis not present

## 2020-04-15 DIAGNOSIS — E034 Atrophy of thyroid (acquired): Secondary | ICD-10-CM | POA: Diagnosis not present

## 2020-04-15 DIAGNOSIS — R5383 Other fatigue: Secondary | ICD-10-CM

## 2020-04-15 MED ORDER — ATORVASTATIN CALCIUM 10 MG PO TABS: ORAL_TABLET | ORAL | 1 refills | Status: DC

## 2020-04-15 MED ORDER — FLUOXETINE HCL 10 MG PO CAPS: 10.0000 mg | ORAL_CAPSULE | Freq: Every day | ORAL | 1 refills | Status: DC

## 2020-04-15 MED ORDER — LEVOTHYROXINE SODIUM 25 MCG PO TABS: ORAL_TABLET | ORAL | 1 refills | Status: DC

## 2020-04-15 MED ORDER — FLUOXETINE HCL 20 MG PO TABS: 20.0000 mg | ORAL_TABLET | Freq: Every day | ORAL | 1 refills | Status: AC

## 2020-04-15 NOTE — Progress Notes (Signed)
Date:  04/15/2020   Name:  Emily Sawyer   DOB:  20-Dec-1975   MRN:  [id number]   Chief Complaint: Hypothyroidism, Hyperlipidemia, and Anxiety (4 and 3)  Hyperlipidemia This is a chronic problem. The current episode started more than 1 year ago. The problem is controlled. Recent lipid tests were reviewed and are normal. She has no history of chronic renal disease, diabetes, hypothyroidism, liver disease, obesity or nephrotic syndrome. Pertinent negatives include no chest pain, focal sensory loss, focal weakness, leg pain, myalgias or shortness of breath. Current antihyperlipidemic treatment includes statins. The current treatment provides mild improvement of lipids. There are no compliance problems.  Risk factors for coronary artery disease include dyslipidemia and hypertension.  Anxiety Presents for follow-up visit. Symptoms include nervous/anxious behavior and suicidal ideas. Patient reports no chest pain, compulsions, confusion, decreased concentration, depressed mood, dizziness, dry mouth, excessive worry, feeling of choking, hyperventilation, impotence, insomnia, irritability, malaise, muscle tension, nausea, obsessions, palpitations, panic, restlessness or shortness of breath. Symptoms occur occasionally. The severity of symptoms is mild.   Side effects of treatment include headaches.  Thyroid Problem Presents for follow-up visit. Symptoms include anxiety. Patient reports no cold intolerance, constipation, depressed mood, diaphoresis, diarrhea, dry skin, fatigue, hair loss, heat intolerance, hoarse voice, leg swelling, menstrual problem, nail problem, palpitations, tremors, visual change, weight gain or weight loss. The symptoms have been stable. Her past medical history is significant for hyperlipidemia. There is no history of diabetes.    Lab Results  Component Value Date   CREATININE 0.89 06/30/2018   BUN 9 06/30/2018   NA 139 06/30/2018   K 4.5 06/30/2018   CL 103 06/30/2018   CO2 23  06/30/2018   Lab Results  Component Value Date   CHOL 177 08/01/2019   HDL 36 (L) 08/01/2019   LDLCALC 126 (H) 08/01/2019   TRIG 81 08/01/2019   CHOLHDL 4.8 (H) 06/30/2018   Lab Results  Component Value Date   TSH 2.630 08/01/2019   No results found for: HGBA1C Lab Results  Component Value Date   WBC 7.0 06/30/2018   HGB 14.3 06/30/2018   HCT 42.1 06/30/2018   MCV 92 06/30/2018   PLT 278 06/30/2018   Lab Results  Component Value Date   ALT 15 08/07/2013   AST 24 08/07/2013   ALKPHOS 52 08/07/2013   BILITOT 0.8 08/07/2013     Review of Systems  Constitutional: Negative.  Negative for chills, diaphoresis, fatigue, fever, irritability, unexpected weight change, weight gain and weight loss.  HENT: Negative for congestion, ear discharge, ear pain, hoarse voice, nosebleeds, rhinorrhea, sinus pressure, sneezing and sore throat.   Eyes: Negative for double vision, photophobia, pain, discharge, redness and itching.  Respiratory: Negative for cough, shortness of breath, wheezing and stridor.   Cardiovascular: Negative for chest pain and palpitations.  Gastrointestinal: Negative for abdominal pain, blood in stool, constipation, diarrhea, nausea and vomiting.  Endocrine: Negative for cold intolerance, heat intolerance, polydipsia, polyphagia and polyuria.  Genitourinary: Negative for dysuria, flank pain, frequency, hematuria, impotence, menstrual problem, pelvic pain, urgency, vaginal bleeding and vaginal discharge.  Musculoskeletal: Negative for arthralgias, back pain and myalgias.  Skin: Negative for rash.  Allergic/Immunologic: Negative for environmental allergies and food allergies.  Neurological: Negative for dizziness, tremors, focal weakness, weakness, light-headedness, numbness and headaches.  Hematological: Negative for adenopathy. Does not bruise/bleed easily.  Psychiatric/Behavioral: Positive for suicidal ideas. Negative for confusion, decreased concentration and  dysphoric mood. The patient is nervous/anxious. The patient does not have insomnia.  Patient Active Problem List   Diagnosis Date Noted  . Thyroid activity decreased 03/01/2018  . H/O total hysterectomy with removal of both tubes and ovaries 02/10/2017  . Dysmenorrhea 09/29/2016  . Menorrhagia with regular cycle 09/29/2016  . Endometriosis 09/20/2016  . Endometrioma of ovary 09/20/2016    Allergies  Allergen Reactions  . Codeine   . Other     dermabond-hives  . Latex Itching    Past Surgical History:  Procedure Laterality Date  . ABDOMINAL HYSTERECTOMY    . GALLBLADDER SURGERY    . LAPAROSCOPY      Social History   Tobacco Use  . Smoking status: Never Smoker  . Smokeless tobacco: Never Used  Vaping Use  . Vaping Use: Never used  Substance Use Topics  . Alcohol use: Yes    Alcohol/week: 0.0 standard drinks  . Drug use: No     Medication list has been reviewed and updated.  Current Meds  Medication Sig  . atorvastatin (LIPITOR) 10 MG tablet TAKE 1 TABLET(10 MG) BY MOUTH DAILY  . FLUoxetine (PROZAC) 10 MG capsule Take 1 capsule by mouth daily. 20+ 10=30 mg daily  . FLUoxetine (PROZAC) 20 MG tablet Take 1 tablet (20 mg total) by mouth daily.  Marland Kitchen levothyroxine (SYNTHROID) 25 MCG tablet TAKE 1 TABLET(25 MCG) BY MOUTH DAILY BEFORE BREAKFAST  . loratadine (CLARITIN) 10 MG tablet Take 10 mg by mouth daily. otc  . Multiple Vitamins-Minerals (MULTIVITAMIN GUMMIES ADULT) CHEW Chew 2 each by mouth daily. otc  . mupirocin ointment (BACTROBAN) 2 % Apply 1 application topically 2 (two) times daily.  Marland Kitchen triamcinolone cream (KENALOG) 0.1 % Apply 1 application topically 2 (two) times daily.    PHQ 2/9 Scores 04/15/2020 08/01/2019 02/07/2019 08/11/2018  PHQ - 2 Score 0 0 0 0  PHQ- 9 Score 4 7 2 4     GAD 7 : Generalized Anxiety Score 04/15/2020 08/01/2019 02/07/2019  Nervous, Anxious, on Edge 1 2 1   Control/stop worrying 0 0 0  Worry too much - different things 0 1 0  Trouble  relaxing 1 1 1   Restless 1 1 1   Easily annoyed or irritable 0 1 0  Afraid - awful might happen 0 0 0  Total GAD 7 Score 3 6 3   Anxiety Difficulty Not difficult at all Not difficult at all Not difficult at all    BP Readings from Last 3 Encounters:  04/15/20 110/80  08/01/19 120/62  02/07/19 120/78    Physical Exam Vitals and nursing note reviewed.  Constitutional:      Appearance: She is well-developed and well-nourished.  HENT:     Head: Normocephalic.     Right Ear: Tympanic membrane, ear canal and external ear normal. There is no impacted cerumen.     Left Ear: Tympanic membrane, ear canal and external ear normal. There is no impacted cerumen.     Nose: Nose normal. No congestion or rhinorrhea.     Mouth/Throat:     Mouth: Oropharynx is clear and moist. Mucous membranes are moist.  Eyes:     General: Lids are everted, no foreign bodies appreciated. No scleral icterus.       Left eye: No foreign body or hordeolum.     Extraocular Movements: EOM normal.     Conjunctiva/sclera: Conjunctivae normal.     Right eye: Right conjunctiva is not injected.     Left eye: Left conjunctiva is not injected.     Pupils: Pupils are equal, round, and reactive to  light.  Neck:     Thyroid: No thyromegaly.     Vascular: No JVD.     Trachea: No tracheal deviation.  Cardiovascular:     Rate and Rhythm: Normal rate and regular rhythm.     Pulses: Intact distal pulses.     Heart sounds: Normal heart sounds. No murmur heard. No friction rub. No gallop.   Pulmonary:     Effort: Pulmonary effort is normal. No respiratory distress.     Breath sounds: Normal breath sounds. No wheezing, rhonchi or rales.  Chest:     Chest wall: No tenderness.  Abdominal:     General: Bowel sounds are normal.     Palpations: Abdomen is soft. There is no hepatosplenomegaly or mass.     Tenderness: There is no abdominal tenderness. There is no right CVA tenderness, left CVA tenderness, guarding or rebound.   Musculoskeletal:        General: No tenderness or edema. Normal range of motion.     Cervical back: Normal range of motion and neck supple.  Lymphadenopathy:     Cervical: No cervical adenopathy.  Skin:    General: Skin is warm.     Findings: No rash.  Neurological:     Mental Status: She is alert and oriented to person, place, and time.     Cranial Nerves: No cranial nerve deficit.     Gait: Gait normal.     Deep Tendon Reflexes: Strength normal. Reflexes normal.  Psychiatric:        Mood and Affect: Mood and affect and mood normal. Mood is not anxious or depressed.        Cognition and Memory: Cognition and memory normal.     Wt Readings from Last 3 Encounters:  04/15/20 211 lb (95.7 kg)  08/01/19 208 lb (94.3 kg)  02/07/19 205 lb (93 kg)    BP 110/80   Pulse 64   Ht 5\' 6"  (1.676 m)   Wt 211 lb (95.7 kg)   LMP 08/22/2016   BMI 34.06 kg/m   Assessment and Plan: 1. Mixed hyperlipidemia Chronic.  Controlled.  Stable.  Continue atorvastatin 10 mg once a day. - atorvastatin (LIPITOR) 10 MG tablet; TAKE 1 TABLET(10 MG) BY MOUTH DAILY  Dispense: 90 tablet; Refill: 1  2. Fatigue, unspecified type Chronic.  Controlled.  Stable.  Since increasing fluoxetine to 30 mg today patient is doing well and currently her PHQ is 4 with a gad score 3.- FLUoxetine (PROZAC) 20 MG tablet; Take 1 tablet (20 mg total) by mouth daily.  Dispense: 90 tablet; Refill: 1 - levothyroxine (SYNTHROID) 25 MCG tablet; TAKE 1 TABLET(25 MCG) BY MOUTH DAILY BEFORE BREAKFAST  Dispense: 90 tablet; Refill: 1 - Renal Function Panel  3. Mild episode of recurrent major depressive disorder (HCC) Chronic.  Controlled.  Stable.  Continue Prozac 30 mg once a day. - FLUoxetine (PROZAC) 20 MG tablet; Take 1 tablet (20 mg total) by mouth daily.  Dispense: 90 tablet; Refill: 1  4. Hypothyroidism due to acquired atrophy of thyroid On it.  Controlled.  Stable.  Will check thyroid function panel with TSH and likely will  continue to 25 mcg daily. - levothyroxine (SYNTHROID) 25 MCG tablet; TAKE 1 TABLET(25 MCG) BY MOUTH DAILY BEFORE BREAKFAST  Dispense: 90 tablet; Refill: 1 - Thyroid Panel With TSH - Renal Function Panel

## 2020-04-17 LAB — RENAL FUNCTION PANEL
Albumin: 4.5 g/dL (ref 3.8–4.8)
BUN/Creatinine Ratio: 14 (ref 9–23)
BUN: 12 mg/dL (ref 6–24)
CO2: 21 mmol/L (ref 20–29)
Calcium: 9.4 mg/dL (ref 8.7–10.2)
Chloride: 101 mmol/L (ref 96–106)
Creatinine, Ser: 0.85 mg/dL (ref 0.57–1.00)
GFR calc Af Amer: 96 mL/min/{1.73_m2} (ref >59)
GFR calc non Af Amer: 84 mL/min/{1.73_m2} (ref >59)
Glucose: 81 mg/dL (ref 65–99)
Phosphorus: 3.1 mg/dL (ref 3.0–4.3)
Potassium: 4.4 mmol/L (ref 3.5–5.2)
Sodium: 139 mmol/L (ref 134–144)

## 2020-04-17 LAB — THYROID PANEL WITH TSH
Free Thyroxine Index: 1.8 (ref 1.2–4.9)
T3 Uptake Ratio: 25 % (ref 24–39)
T4, Total: 7.1 ug/dL (ref 4.5–12.0)
TSH: 2.03 u[IU]/mL (ref 0.450–4.500)

## 2020-10-07 ENCOUNTER — Other Ambulatory Visit: Payer: Self-pay | Admitting: Family Medicine

## 2020-10-07 DIAGNOSIS — R5383 Other fatigue: Secondary | ICD-10-CM

## 2020-10-07 DIAGNOSIS — E034 Atrophy of thyroid (acquired): Secondary | ICD-10-CM

## 2020-10-07 DIAGNOSIS — E782 Mixed hyperlipidemia: Secondary | ICD-10-CM

## 2020-10-07 NOTE — Telephone Encounter (Signed)
Requested medications are due for refill today yes  Requested medications are on the active medication list yes  Last refill 5/15  Last visit 04/2020  Future visit scheduled no  Notes to clinic Labs for lipids are more than 32 days old, please assess.

## 2020-10-15 ENCOUNTER — Other Ambulatory Visit: Payer: Self-pay

## 2020-10-15 ENCOUNTER — Ambulatory Visit: Payer: BC Managed Care – PPO | Admitting: Family Medicine

## 2020-10-15 ENCOUNTER — Encounter: Payer: Self-pay | Admitting: Family Medicine

## 2020-10-15 VITALS — BP 100/80 | HR 80 | Ht 66.0 in | Wt 202.0 lb

## 2020-10-15 DIAGNOSIS — E034 Atrophy of thyroid (acquired): Secondary | ICD-10-CM

## 2020-10-15 DIAGNOSIS — R5383 Other fatigue: Secondary | ICD-10-CM | POA: Diagnosis not present

## 2020-10-15 DIAGNOSIS — E782 Mixed hyperlipidemia: Secondary | ICD-10-CM | POA: Diagnosis not present

## 2020-10-15 DIAGNOSIS — J019 Acute sinusitis, unspecified: Secondary | ICD-10-CM

## 2020-10-15 DIAGNOSIS — F330 Major depressive disorder, recurrent, mild: Secondary | ICD-10-CM

## 2020-10-15 MED ORDER — LEVOTHYROXINE SODIUM 25 MCG PO TABS: ORAL_TABLET | ORAL | 1 refills | Status: DC

## 2020-10-15 MED ORDER — AZITHROMYCIN 250 MG PO TABS: ORAL_TABLET | ORAL | 0 refills | Status: AC

## 2020-10-15 MED ORDER — ATORVASTATIN CALCIUM 10 MG PO TABS: ORAL_TABLET | ORAL | 1 refills | Status: DC

## 2020-10-15 NOTE — Progress Notes (Signed)
Date:  10/15/2020   Name:  Emily Sawyer   DOB:  11/10/1975   MRN:  [id number]   Chief Complaint: Hypothyroidism and Hyperlipidemia  Hyperlipidemia This is a chronic problem. The current episode started more than 1 year ago. The problem is controlled. Recent lipid tests were reviewed and are normal. Exacerbating diseases include hypothyroidism. She has no history of chronic renal disease, diabetes or liver disease. Pertinent negatives include no chest pain, focal sensory loss, focal weakness, leg pain, myalgias or shortness of breath. Current antihyperlipidemic treatment includes statins. The current treatment provides moderate improvement of lipids. There are no compliance problems.  Risk factors for coronary artery disease include dyslipidemia.  Thyroid Problem Presents for follow-up visit. Symptoms include anxiety. Patient reports no cold intolerance, constipation, depressed mood, diaphoresis, diarrhea, dry skin, fatigue, hair loss, heat intolerance, hoarse voice, leg swelling, menstrual problem, nail problem, palpitations, tremors, visual change, weight gain or weight loss. The symptoms have been stable. Her past medical history is significant for hyperlipidemia. There is no history of diabetes.  Sinusitis This is a new problem. The current episode started in the past 7 days. The problem has been gradually worsening since onset. There has been no fever. The pain is mild. Associated symptoms include congestion, sinus pressure, sneezing and swollen glands. Pertinent negatives include no chills, coughing, diaphoresis, ear pain, headaches, hoarse voice, neck pain, shortness of breath or sore throat. Past treatments include nothing.   Lab Results  Component Value Date   CREATININE 0.85 04/16/2020   BUN 12 04/16/2020   NA 139 04/16/2020   K 4.4 04/16/2020   CL 101 04/16/2020   CO2 21 04/16/2020   Lab Results  Component Value Date   CHOL 177 08/01/2019   HDL 36 (L) 08/01/2019   LDLCALC 126  (H) 08/01/2019   TRIG 81 08/01/2019   CHOLHDL 4.8 (H) 06/30/2018   Lab Results  Component Value Date   TSH 2.030 04/16/2020   No results found for: HGBA1C Lab Results  Component Value Date   WBC 7.0 06/30/2018   HGB 14.3 06/30/2018   HCT 42.1 06/30/2018   MCV 92 06/30/2018   PLT 278 06/30/2018   Lab Results  Component Value Date   ALT 15 08/07/2013   AST 24 08/07/2013   ALKPHOS 52 08/07/2013   BILITOT 0.8 08/07/2013     Review of Systems  Constitutional:  Negative for chills, diaphoresis, fatigue, fever, weight gain and weight loss.  HENT:  Positive for congestion, sinus pressure and sneezing. Negative for drooling, ear discharge, ear pain, hoarse voice and sore throat.   Respiratory:  Negative for cough, shortness of breath and wheezing.   Cardiovascular:  Negative for chest pain, palpitations and leg swelling.  Gastrointestinal:  Negative for abdominal pain, blood in stool, constipation, diarrhea and nausea.  Endocrine: Negative for cold intolerance, heat intolerance and polydipsia.  Genitourinary:  Negative for dysuria, frequency, hematuria, menstrual problem and urgency.  Musculoskeletal:  Negative for back pain, myalgias and neck pain.  Skin:  Negative for rash.  Allergic/Immunologic: Negative for environmental allergies.  Neurological:  Negative for dizziness, tremors, focal weakness and headaches.  Hematological:  Does not bruise/bleed easily.  Psychiatric/Behavioral:  Negative for suicidal ideas. The patient is nervous/anxious.    Patient Active Problem List   Diagnosis Date Noted   Thyroid activity decreased 03/01/2018   H/O total hysterectomy with removal of both tubes and ovaries 02/10/2017   Dysmenorrhea 09/29/2016   Menorrhagia with regular cycle 09/29/2016  Endometriosis 09/20/2016   Endometrioma of ovary 09/20/2016    Allergies  Allergen Reactions   Codeine    Other     dermabond-hives   Latex Itching    Past Surgical History:  Procedure  Laterality Date   ABDOMINAL HYSTERECTOMY     GALLBLADDER SURGERY     LAPAROSCOPY      Social History   Tobacco Use   Smoking status: Never   Smokeless tobacco: Never  Vaping Use   Vaping Use: Never used  Substance Use Topics   Alcohol use: Yes    Alcohol/week: 0.0 standard drinks   Drug use: No     Medication list has been reviewed and updated.  Current Meds  Medication Sig   atorvastatin (LIPITOR) 10 MG tablet TAKE 1 TABLET(10 MG) BY MOUTH DAILY   buPROPion (WELLBUTRIN XL) 300 MG 24 hr tablet Take 1 tablet by mouth daily. [facility]   estradiol (ESTRACE) 2 MG tablet Take 1 tablet by mouth daily. Dr Shift/ [facility]   FLUoxetine (PROZAC) 10 MG capsule Take 1 capsule (10 mg total) by mouth daily. 20+ 10=30 mg daily   FLUoxetine (PROZAC) 20 MG tablet Take 1 tablet (20 mg total) by mouth daily.   levothyroxine (SYNTHROID) 25 MCG tablet TAKE 1 TABLET(25 MCG) BY MOUTH DAILY BEFORE BREAKFAST   loratadine (CLARITIN) 10 MG tablet Take 10 mg by mouth daily. otc   Multiple Vitamins-Minerals (MULTIVITAMIN GUMMIES ADULT) CHEW Chew 2 each by mouth daily. otc   mupirocin ointment (BACTROBAN) 2 % Apply 1 application topically 2 (two) times daily.   triamcinolone cream (KENALOG) 0.1 % Apply 1 application topically 2 (two) times daily.    PHQ 2/9 Scores 10/15/2020 04/15/2020 08/01/2019 02/07/2019  PHQ - 2 Score 0 0 0 0  PHQ- 9 Score '1 4 7 2    '$ GAD 7 : Generalized Anxiety Score 10/15/2020 04/15/2020 08/01/2019 02/07/2019  Nervous, Anxious, on Edge '1 1 2 1  '$ Control/stop worrying 0 0 0 0  Worry too much - different things 0 0 1 0  Trouble relaxing 0 '1 1 1  '$ Restless '1 1 1 1  '$ Easily annoyed or irritable 0 0 1 0  Afraid - awful might happen 0 0 0 0  Total GAD 7 Score '2 3 6 3  '$ Anxiety Difficulty Not difficult at all Not difficult at all Not difficult at all Not difficult at all    BP Readings from Last 3 Encounters:  10/15/20 100/80  04/15/20 110/80  08/01/19 120/62    Physical Exam Vitals and  nursing note reviewed.  Constitutional:      Appearance: She is well-developed.  HENT:     Head: Normocephalic.     Right Ear: Tympanic membrane, ear canal and external ear normal. Swelling present.     Left Ear: Tympanic membrane, ear canal and external ear normal. Swelling present.     Nose:     Right Sinus: No maxillary sinus tenderness or frontal sinus tenderness.     Left Sinus: No maxillary sinus tenderness or frontal sinus tenderness.     Mouth/Throat:     Lips: Pink.     Mouth: Mucous membranes are moist.     Dentition: Normal dentition.     Tongue: No lesions. Tongue does not deviate from midline.     Palate: No mass and lesions.     Pharynx: Oropharynx is clear. Uvula midline.     Tonsils: No tonsillar exudate or tonsillar abscesses.  Eyes:  General: Lids are everted, no foreign bodies appreciated. No scleral icterus.       Left eye: No foreign body or hordeolum.     Conjunctiva/sclera: Conjunctivae normal.     Right eye: Right conjunctiva is not injected.     Left eye: Left conjunctiva is not injected.     Pupils: Pupils are equal, round, and reactive to light.  Neck:     Thyroid: No thyroid mass, thyromegaly or thyroid tenderness.     Vascular: No carotid bruit or JVD.     Trachea: No tracheal deviation.  Cardiovascular:     Rate and Rhythm: Normal rate and regular rhythm.     Heart sounds: Normal heart sounds, S1 normal and S2 normal. No murmur heard. No systolic murmur is present.  No diastolic murmur is present.    No friction rub. No gallop. No S3 or S4 sounds.  Pulmonary:     Effort: Pulmonary effort is normal. No respiratory distress.     Breath sounds: Normal breath sounds. No wheezing or rales.  Abdominal:     General: Bowel sounds are normal.     Palpations: Abdomen is soft. There is no mass.     Tenderness: There is no abdominal tenderness. There is no guarding or rebound.  Musculoskeletal:        General: No tenderness. Normal range of motion.      Cervical back: Normal range of motion and neck supple. No tenderness.  Lymphadenopathy:     Head:     Right side of head: No submental, submandibular or tonsillar adenopathy.     Left side of head: Submandibular adenopathy present. No submental or tonsillar adenopathy.     Cervical: No cervical adenopathy.     Comments: tender  Skin:    General: Skin is warm.     Findings: No rash.  Neurological:     Mental Status: She is alert and oriented to person, place, and time.     Cranial Nerves: No cranial nerve deficit.     Deep Tendon Reflexes: Reflexes normal.  Psychiatric:        Mood and Affect: Mood is not anxious or depressed.    Wt Readings from Last 3 Encounters:  10/15/20 202 lb (91.6 kg)  04/15/20 211 lb (95.7 kg)  08/01/19 208 lb (94.3 kg)    BP 100/80   Pulse 80   Ht '5\' 6"'$  (1.676 m)   Wt 202 lb (91.6 kg)   LMP 08/22/2016   BMI 32.60 kg/m   Assessment and Plan:  1. Mixed hyperlipidemia .  Controlled.  Stable.  Continue atorvastatin 10 mg once a day.  Will check lipid panel for LDL status. - atorvastatin (LIPITOR) 10 MG tablet; Take 1 tablet by mouth daily  Dispense: 90 tablet; Refill: 1 - Lipid Panel With LDL/HDL Ratio  2. Fatigue, unspecified type .  Fatigue with a diagnosis of hypothyroidism we will continue with Synthroid 25 mg once a day. - levothyroxine (SYNTHROID) 25 MCG tablet; Take 1 tablet by mouth daily  Dispense: 90 tablet; Refill: 1 - Comprehensive Metabolic Panel (CMET)  3. Mild episode of recurrent major depressive disorder (HCC) Chronic.  Controlled.  Stable.  PHQ is 1.  Gad score is 2.  Currently being treated by behavioral medicine with SSRI and Wellbutrin.  4. Hypothyroidism due to acquired atrophy of thyroid Chronic.  Controlled.  Stable.  Will check TSH for current status of control in the meantime we are anticipating continue with levothyroxine 25  mcg daily. - levothyroxine (SYNTHROID) 25 MCG tablet; Take 1 tablet by mouth daily  Dispense: 90  tablet; Refill: 1 - TSH  5. Acute non-recurrent sinusitis, unspecified location Patient has a palpable left submandibular lymph node.  Exam notes that there may be a sinus infection along with her history of nasal drainage that has some color to it.  We will treat with azithromycin to 50 mg 2 today followed by 1 a day for 4 days. - azithromycin (ZITHROMAX) 250 MG tablet; Take 2 tablets on day 1, then 1 tablet daily on days 2 through 5  Dispense: 6 tablet; Refill: 0

## 2020-10-16 LAB — COMPREHENSIVE METABOLIC PANEL
ALT: 22 IU/L (ref 0–32)
AST: 22 IU/L (ref 0–40)
Albumin/Globulin Ratio: 2 (ref 1.2–2.2)
Albumin: 4.5 g/dL (ref 3.8–4.8)
Alkaline Phosphatase: 85 IU/L (ref 44–121)
BUN/Creatinine Ratio: 6 — ABNORMAL LOW (ref 9–23)
BUN: 6 mg/dL (ref 6–24)
Bilirubin Total: 0.4 mg/dL (ref 0.0–1.2)
CO2: 24 mmol/L (ref 20–29)
Calcium: 9.2 mg/dL (ref 8.7–10.2)
Chloride: 102 mmol/L (ref 96–106)
Creatinine, Ser: 0.96 mg/dL (ref 0.57–1.00)
Globulin, Total: 2.3 g/dL (ref 1.5–4.5)
Glucose: 90 mg/dL (ref 65–99)
Potassium: 4.7 mmol/L (ref 3.5–5.2)
Sodium: 140 mmol/L (ref 134–144)
Total Protein: 6.8 g/dL (ref 6.0–8.5)
eGFR: 74 mL/min/{1.73_m2} (ref >59)

## 2020-10-16 LAB — TSH: TSH: 1.76 u[IU]/mL (ref 0.450–4.500)

## 2020-10-16 LAB — LIPID PANEL WITH LDL/HDL RATIO
Cholesterol, Total: 151 mg/dL (ref 100–199)
HDL: 61 mg/dL (ref >39)
LDL Chol Calc (NIH): 72 mg/dL (ref 0–99)
LDL/HDL Ratio: 1.2 ratio (ref 0.0–3.2)
Triglycerides: 96 mg/dL (ref 0–149)
VLDL Cholesterol Cal: 18 mg/dL (ref 5–40)

## 2021-04-17 ENCOUNTER — Encounter: Payer: Self-pay | Admitting: Family Medicine

## 2021-04-17 ENCOUNTER — Other Ambulatory Visit: Payer: Self-pay

## 2021-04-17 ENCOUNTER — Ambulatory Visit: Payer: BC Managed Care – PPO | Admitting: Family Medicine

## 2021-04-17 VITALS — BP 98/78 | Ht 66.0 in | Wt 206.0 lb

## 2021-04-17 DIAGNOSIS — E034 Atrophy of thyroid (acquired): Secondary | ICD-10-CM

## 2021-04-17 DIAGNOSIS — F330 Major depressive disorder, recurrent, mild: Secondary | ICD-10-CM

## 2021-04-17 DIAGNOSIS — E782 Mixed hyperlipidemia: Secondary | ICD-10-CM | POA: Diagnosis not present

## 2021-04-17 MED ORDER — ATORVASTATIN CALCIUM 10 MG PO TABS: ORAL_TABLET | ORAL | 1 refills | Status: DC

## 2021-04-17 MED ORDER — LEVOTHYROXINE SODIUM 25 MCG PO TABS: ORAL_TABLET | ORAL | 1 refills | Status: DC

## 2021-04-17 NOTE — Progress Notes (Signed)
Date:  04/17/2021   Name:  Emily Sawyer   DOB:  03-04-1975   MRN:  [id number]   Chief Complaint: Hyperlipidemia and Hypothyroidism  Hyperlipidemia This is a chronic problem. The current episode started more than 1 year ago. The problem is controlled. Recent lipid tests were reviewed and are normal. Exacerbating diseases include hypothyroidism. She has no history of chronic renal disease or diabetes. Pertinent negatives include no chest pain, myalgias or shortness of breath. Current antihyperlipidemic treatment includes statins. The current treatment provides moderate improvement of lipids. There are no compliance problems.  Risk factors for coronary artery disease include dyslipidemia.  Thyroid Problem Presents for follow-up visit. Symptoms include dry skin, fatigue and heat intolerance. Patient reports no anxiety, cold intolerance, constipation, depressed mood, diarrhea, hair loss, menstrual problem, nail problem, palpitations, weight gain or weight loss. Her past medical history is significant for hyperlipidemia. There is no history of diabetes.   Lab Results  Component Value Date   NA 140 10/15/2020   K 4.7 10/15/2020   CO2 24 10/15/2020   GLUCOSE 90 10/15/2020   BUN 6 10/15/2020   CREATININE 0.96 10/15/2020   CALCIUM 9.2 10/15/2020   EGFR 74 10/15/2020   GFRNONAA 84 04/16/2020   Lab Results  Component Value Date   CHOL 151 10/15/2020   HDL 61 10/15/2020   LDLCALC 72 10/15/2020   TRIG 96 10/15/2020   CHOLHDL 4.8 (H) 06/30/2018   Lab Results  Component Value Date   TSH 1.760 10/15/2020   No results found for: HGBA1C Lab Results  Component Value Date   WBC 7.0 06/30/2018   HGB 14.3 06/30/2018   HCT 42.1 06/30/2018   MCV 92 06/30/2018   PLT 278 06/30/2018   Lab Results  Component Value Date   ALT 22 10/15/2020   AST 22 10/15/2020   ALKPHOS 85 10/15/2020   BILITOT 0.4 10/15/2020   No results found for: 25OHVITD2, 25OHVITD3, VD25OH   Review of Systems   Constitutional:  Positive for fatigue. Negative for chills, fever, weight gain and weight loss.  HENT:  Negative for drooling, ear discharge, ear pain and sore throat.   Respiratory:  Negative for cough, shortness of breath and wheezing.   Cardiovascular:  Negative for chest pain, palpitations and leg swelling.  Gastrointestinal:  Negative for abdominal pain, blood in stool, constipation, diarrhea and nausea.  Endocrine: Positive for heat intolerance. Negative for cold intolerance and polydipsia.  Genitourinary:  Negative for dysuria, frequency, hematuria, menstrual problem and urgency.  Musculoskeletal:  Negative for back pain, myalgias and neck pain.  Skin:  Negative for rash.  Allergic/Immunologic: Negative for environmental allergies.  Neurological:  Negative for dizziness and headaches.  Hematological:  Does not bruise/bleed easily.  Psychiatric/Behavioral:  Negative for suicidal ideas. The patient is not nervous/anxious.    Patient Active Problem List   Diagnosis Date Noted   Thyroid activity decreased 03/01/2018   H/O total hysterectomy with removal of both tubes and ovaries 02/10/2017   Dysmenorrhea 09/29/2016   Menorrhagia with regular cycle 09/29/2016   Endometriosis 09/20/2016   Endometrioma of ovary 09/20/2016    Allergies  Allergen Reactions   Codeine    Other     dermabond-hives   Latex Itching    Past Surgical History:  Procedure Laterality Date   ABDOMINAL HYSTERECTOMY     GALLBLADDER SURGERY     LAPAROSCOPY      Social History   Tobacco Use   Smoking status: Never   Smokeless tobacco:  Never  Vaping Use   Vaping Use: Never used  Substance Use Topics   Alcohol use: Yes    Alcohol/week: 0.0 standard drinks   Drug use: No     Medication list has been reviewed and updated.  Current Meds  Medication Sig   atorvastatin (LIPITOR) 10 MG tablet Take 1 tablet by mouth daily   buPROPion (WELLBUTRIN XL) 300 MG 24 hr tablet Take 1 tablet by mouth daily.  [facility]   estradiol (ESTRACE) 2 MG tablet Take 1 tablet by mouth daily. Dr Shift/ [facility]   FLUoxetine (PROZAC) 10 MG capsule Take 1 capsule (10 mg total) by mouth daily. 20+ 10=30 mg daily (Patient taking differently: Take 10 mg by mouth daily. 20+ 10=30 mg daily/ [facility])   FLUoxetine (PROZAC) 20 MG tablet Take 1 tablet (20 mg total) by mouth daily.   levothyroxine (SYNTHROID) 25 MCG tablet Take 1 tablet by mouth daily   loratadine (CLARITIN) 10 MG tablet Take 10 mg by mouth daily. otc   mupirocin ointment (BACTROBAN) 2 % Apply 1 application topically 2 (two) times daily.   triamcinolone cream (KENALOG) 0.1 % Apply 1 application topically 2 (two) times daily.    PHQ 2/9 Scores 04/17/2021 10/15/2020 04/15/2020 08/01/2019  PHQ - 2 Score 1 0 0 0  PHQ- 9 Score '6 1 4 7    ' GAD 7 : Generalized Anxiety Score 04/17/2021 10/15/2020 04/15/2020 08/01/2019  Nervous, Anxious, on Edge 0 '1 1 2  ' Control/stop worrying 0 0 0 0  Worry too much - different things 0 0 0 1  Trouble relaxing 0 0 1 1  Restless '2 1 1 1  ' Easily annoyed or irritable 0 0 0 1  Afraid - awful might happen 0 0 0 0  Total GAD 7 Score '2 2 3 6  ' Anxiety Difficulty Not difficult at all Not difficult at all Not difficult at all Not difficult at all    BP Readings from Last 3 Encounters:  04/17/21 98/78  10/15/20 100/80  04/15/20 110/80    Physical Exam Vitals and nursing note reviewed.  Constitutional:      Appearance: She is well-developed.  HENT:     Head: Normocephalic.     Right Ear: Tympanic membrane, ear canal and external ear normal.     Left Ear: Tympanic membrane, ear canal and external ear normal.     Nose: Nose normal.  Eyes:     General: Lids are everted, no foreign bodies appreciated. No scleral icterus.       Left eye: No foreign body or hordeolum.     Conjunctiva/sclera: Conjunctivae normal.     Right eye: Right conjunctiva is not injected.     Left eye: Left conjunctiva is not injected.     Pupils: Pupils are  equal, round, and reactive to light.  Neck:     Thyroid: No thyroid mass, thyromegaly or thyroid tenderness.     Vascular: No JVD.     Trachea: No tracheal deviation.  Cardiovascular:     Rate and Rhythm: Normal rate and regular rhythm.     Heart sounds: Normal heart sounds. No murmur heard.   No friction rub. No gallop.  Pulmonary:     Effort: Pulmonary effort is normal. No respiratory distress.     Breath sounds: Normal breath sounds. No wheezing, rhonchi or rales.  Abdominal:     General: Bowel sounds are normal.     Palpations: Abdomen is soft. There is no mass.  Tenderness: There is no abdominal tenderness. There is no guarding or rebound.  Musculoskeletal:        General: No tenderness. Normal range of motion.     Cervical back: Normal range of motion and neck supple.  Lymphadenopathy:     Cervical: No cervical adenopathy.     Right cervical: No superficial, deep or posterior cervical adenopathy.    Left cervical: No superficial, deep or posterior cervical adenopathy.  Skin:    General: Skin is warm.     Findings: No rash.  Neurological:     Mental Status: She is alert.  Psychiatric:        Mood and Affect: Mood is not anxious or depressed.    Wt Readings from Last 3 Encounters:  04/17/21 206 lb (93.4 kg)  10/15/20 202 lb (91.6 kg)  04/15/20 211 lb (95.7 kg)    BP 98/78    Ht '5\' 6"'  (1.676 m)    Wt 206 lb (93.4 kg)    LMP 08/22/2016    BMI 33.25 kg/m   Assessment and Plan:  1. Hypothyroidism due to acquired atrophy of thyroid Chronic.  Controlled.  Stable.  Review of TSH has been in acceptable range in the last 2 years we will hold on checking TSH today and will recheck in 6 months.  In the meantime we will continue levothyroxine 25 mcg daily. - levothyroxine (SYNTHROID) 25 MCG tablet; Take 1 tablet by mouth daily  Dispense: 90 tablet; Refill: 1  2. Mixed hyperlipidemia Chronic.  Controlled.  Stable.  Tolerating atorvastatin 10 mg once a day and we will  continue this.  Review of lipids have been normal and stable over the past 6 months.  Reemphasized diet along with taking of the statin agent. - atorvastatin (LIPITOR) 10 MG tablet; Take 1 tablet by mouth daily  Dispense: 90 tablet; Refill: 1  3. Mild episode of recurrent major depressive disorder ([place]) Followed by [facility] psychiatry and is currently on fluoxetine and bupropion.  PHQ is 6 and gad score is 2.  Patient's disposition is good today and she will continue with follow-up with psychiatry and current medications.

## 2021-06-04 DIAGNOSIS — F32A Depression, unspecified: Secondary | ICD-10-CM | POA: Insufficient documentation

## 2021-10-16 ENCOUNTER — Ambulatory Visit: Payer: BC Managed Care – PPO | Admitting: Family Medicine

## 2021-10-16 ENCOUNTER — Encounter: Payer: Self-pay | Admitting: Family Medicine

## 2021-10-16 ENCOUNTER — Telehealth: Payer: Self-pay

## 2021-10-16 VITALS — BP 110/80 | HR 80 | Ht 66.0 in | Wt 197.0 lb

## 2021-10-16 DIAGNOSIS — Z1231 Encounter for screening mammogram for malignant neoplasm of breast: Secondary | ICD-10-CM | POA: Diagnosis not present

## 2021-10-16 DIAGNOSIS — F330 Major depressive disorder, recurrent, mild: Secondary | ICD-10-CM

## 2021-10-16 DIAGNOSIS — E034 Atrophy of thyroid (acquired): Secondary | ICD-10-CM

## 2021-10-16 DIAGNOSIS — E782 Mixed hyperlipidemia: Secondary | ICD-10-CM | POA: Diagnosis not present

## 2021-10-16 DIAGNOSIS — Z1211 Encounter for screening for malignant neoplasm of colon: Secondary | ICD-10-CM

## 2021-10-16 MED ORDER — ATORVASTATIN CALCIUM 10 MG PO TABS: ORAL_TABLET | ORAL | 1 refills | Status: DC

## 2021-10-16 MED ORDER — LEVOTHYROXINE SODIUM 25 MCG PO TABS: ORAL_TABLET | ORAL | 1 refills | Status: DC

## 2021-10-16 NOTE — Telephone Encounter (Signed)
Called pt with mammo in [place] on Sept 13/23 '@840'$ 

## 2021-10-16 NOTE — Progress Notes (Signed)
Date:  10/16/2021   Name:  Emily Sawyer   DOB:  1976/01/28   MRN:  [id number]   Chief Complaint: Hypothyroidism and Hyperlipidemia  Hyperlipidemia This is a chronic problem. The current episode started more than 1 year ago. The problem is controlled. Recent lipid tests were reviewed and are normal. Exacerbating diseases include hypothyroidism. Emily Sawyer has no history of chronic renal disease, diabetes, liver disease, obesity or nephrotic syndrome. There are no known factors aggravating her hyperlipidemia. Pertinent negatives include no chest pain, focal sensory loss, leg pain, myalgias or shortness of breath. Current antihyperlipidemic treatment includes statins. The current treatment provides moderate improvement of lipids. There are no compliance problems.   Thyroid Problem Presents for follow-up visit. Symptoms include dry skin. Patient reports no anxiety, cold intolerance, constipation, depressed mood, diaphoresis, diarrhea, fatigue, hair loss, heat intolerance, hoarse voice, leg swelling, menstrual problem, nail problem, palpitations, tremors, visual change, weight gain or weight loss. The symptoms have been improving. Her past medical history is significant for hyperlipidemia. There is no history of diabetes.    Lab Results  Component Value Date   NA 140 10/15/2020   K 4.7 10/15/2020   CO2 24 10/15/2020   GLUCOSE 90 10/15/2020   BUN 6 10/15/2020   CREATININE 0.96 10/15/2020   CALCIUM 9.2 10/15/2020   EGFR 74 10/15/2020   GFRNONAA 84 04/16/2020   Lab Results  Component Value Date   CHOL 151 10/15/2020   HDL 61 10/15/2020   LDLCALC 72 10/15/2020   TRIG 96 10/15/2020   CHOLHDL 4.8 (H) 06/30/2018   Lab Results  Component Value Date   TSH 1.760 10/15/2020   No results found for: "HGBA1C" Lab Results  Component Value Date   WBC 7.0 06/30/2018   HGB 14.3 06/30/2018   HCT 42.1 06/30/2018   MCV 92 06/30/2018   PLT 278 06/30/2018   Lab Results  Component Value Date   ALT 22  10/15/2020   AST 22 10/15/2020   ALKPHOS 85 10/15/2020   BILITOT 0.4 10/15/2020   No results found for: "25OHVITD2", "25OHVITD3", "VD25OH"   Review of Systems  Constitutional:  Negative for diaphoresis, fatigue, weight gain and weight loss.  HENT:  Negative for hoarse voice.   Respiratory:  Negative for shortness of breath.   Cardiovascular:  Negative for chest pain and palpitations.  Gastrointestinal:  Negative for abdominal pain, blood in stool, constipation and diarrhea.  Endocrine: Negative for cold intolerance, heat intolerance, polydipsia and polyuria.  Genitourinary:  Negative for difficulty urinating and menstrual problem.  Musculoskeletal:  Negative for back pain and myalgias.  Neurological:  Negative for tremors.  Psychiatric/Behavioral:  The patient is not nervous/anxious.     Patient Active Problem List   Diagnosis Date Noted   Depression 06/04/2021   Thyroid activity decreased 03/01/2018   Status post total hysterectomy 02/10/2017   Dysmenorrhea 09/29/2016   Menorrhagia with regular cycle 09/29/2016   Endometriosis 09/20/2016   Endometrioma of ovary 09/20/2016    Allergies  Allergen Reactions   Codeine    Other     dermabond-hives   Latex Itching    Past Surgical History:  Procedure Laterality Date   GALLBLADDER SURGERY     LAPAROSCOPY     TOTAL ABDOMINAL HYSTERECTOMY      Social History   Tobacco Use   Smoking status: Never   Smokeless tobacco: Never  Vaping Use   Vaping Use: Never used  Substance Use Topics   Alcohol use: Yes  Alcohol/week: 0.0 standard drinks of alcohol   Drug use: No     Medication list has been reviewed and updated.  Current Meds  Medication Sig   atorvastatin (LIPITOR) 10 MG tablet Take 1 tablet by mouth daily   buPROPion (WELLBUTRIN XL) 300 MG 24 hr tablet Take 1 tablet by mouth daily. [facility]   estradiol (ESTRACE) 2 MG tablet Take 1 tablet by mouth daily. Dr Shift/ [facility]   FLUoxetine (PROZAC) 10 MG capsule Take  1 capsule (10 mg total) by mouth daily. 20+ 10=30 mg daily (Patient taking differently: Take 10 mg by mouth daily. 20+ 10=30 mg daily/ [facility])   FLUoxetine (PROZAC) 20 MG tablet Take 1 tablet (20 mg total) by mouth daily.   levothyroxine (SYNTHROID) 25 MCG tablet Take 1 tablet by mouth daily   loratadine (CLARITIN) 10 MG tablet Take 10 mg by mouth daily. otc   mupirocin ointment (BACTROBAN) 2 % Apply 1 application topically 2 (two) times daily.       10/16/2021    7:56 AM 04/17/2021    8:09 AM 10/15/2020    8:30 AM 04/15/2020   11:16 AM  GAD 7 : Generalized Anxiety Score  Nervous, Anxious, on Edge 0 0 1 1  Control/stop worrying 0 0 0 0  Worry too much - different things 0 0 0 0  Trouble relaxing 0 0 0 1  Restless 0 '2 1 1  ' Easily annoyed or irritable 0 0 0 0  Afraid - awful might happen 0 0 0 0  Total GAD 7 Score 0 '2 2 3  ' Anxiety Difficulty Not difficult at all Not difficult at all Not difficult at all Not difficult at all       10/16/2021    7:56 AM 04/17/2021    8:08 AM 10/15/2020    8:30 AM  Depression screen PHQ 2/9  Decreased Interest 0 0 0  Down, Depressed, Hopeless 0 1 0  PHQ - 2 Score 0 1 0  Altered sleeping 0 2 0  Tired, decreased energy 0 2 0  Change in appetite 0 0 0  Feeling bad or failure about yourself  0 0 0  Trouble concentrating 0 0 0  Moving slowly or fidgety/restless 0 1 1  Suicidal thoughts 0 0 0  PHQ-9 Score 0 6 1  Difficult doing work/chores Not difficult at all Not difficult at all Not difficult at all    BP Readings from Last 3 Encounters:  10/16/21 110/80  04/17/21 98/78  10/15/20 100/80    Physical Exam Vitals and nursing note reviewed. Exam conducted with a chaperone present.  Constitutional:      General: Emily Sawyer is not in acute distress.    Appearance: Emily Sawyer is not diaphoretic.  HENT:     Head: Normocephalic and atraumatic.     Right Ear: External ear normal.     Left Ear: External ear normal.     Nose: Nose normal.  Eyes:     General:         Right eye: No discharge.        Left eye: No discharge.     Conjunctiva/sclera: Conjunctivae normal.     Pupils: Pupils are equal, round, and reactive to light.  Neck:     Thyroid: No thyroid mass, thyromegaly or thyroid tenderness.     Vascular: No JVD.  Cardiovascular:     Rate and Rhythm: Normal rate and regular rhythm.     Heart sounds: Normal heart sounds.  No murmur heard.    No friction rub. No gallop.  Pulmonary:     Effort: Pulmonary effort is normal.     Breath sounds: Normal breath sounds.  Abdominal:     General: Bowel sounds are normal.     Palpations: Abdomen is soft. There is no mass.     Tenderness: There is no abdominal tenderness. There is no guarding.  Musculoskeletal:        General: Normal range of motion.     Cervical back: Normal range of motion and neck supple.  Lymphadenopathy:     Cervical: No cervical adenopathy.     Upper Body:     Right upper body: No axillary adenopathy.     Left upper body: No axillary adenopathy.  Skin:    General: Skin is warm and dry.  Neurological:     Mental Status: Emily Sawyer is alert.     Deep Tendon Reflexes: Reflexes are normal and symmetric.     Wt Readings from Last 3 Encounters:  10/16/21 197 lb (89.4 kg)  04/17/21 206 lb (93.4 kg)  10/15/20 202 lb (91.6 kg)    BP 110/80   Pulse 80   Ht '5\' 6"'  (1.676 m)   Wt 197 lb (89.4 kg)   LMP 08/22/2016   BMI 31.80 kg/m   Assessment and Plan:  1. Hypothyroidism due to acquired atrophy of thyroid Chronic.  Controlled.  Stable.  Asymptomatic today.  Tolerating medication well.  We will likely continue levothyroxine 25 mcg daily pending TSH.  We will also check.  For electrolytes - levothyroxine (SYNTHROID) 25 MCG tablet; Take 1 tablet by mouth daily  Dispense: 90 tablet; Refill: 1 - TSH - Comprehensive Metabolic Panel (CMET)  2. Mixed hyperlipidemia Chronic.  Controlled.  Stable.  Continue atorvastatin 10 mg once a day.  Check lipid panel and comprehensive metabolic  panel. - atorvastatin (LIPITOR) 10 MG tablet; Take 1 tablet by mouth daily  Dispense: 90 tablet; Refill: 1 - Lipid Panel With LDL/HDL Ratio - Comprehensive Metabolic Panel (CMET)  3. Mild episode of recurrent major depressive disorder ([facility]) Chronic.  Followed by [facility]  Chronic. Prozac as directed  4. Breast cancer screening by mammogram Breast exam is negative and no axillary adenopathy.  Schedule for 3D for screening. - MM 3D SCREEN BREAST BILATERAL  5. Colon cancer screening Discussed with patient and referral to gastroenterology. - Ambulatory referral to Gastroenterology    Otilio Miu, MD

## 2021-10-17 LAB — COMPREHENSIVE METABOLIC PANEL
ALT: 16 IU/L (ref 0–32)
AST: 22 IU/L (ref 0–40)
Albumin/Globulin Ratio: 1.6 (ref 1.2–2.2)
Albumin: 4.4 g/dL (ref 3.9–4.9)
Alkaline Phosphatase: 81 IU/L (ref 44–121)
BUN/Creatinine Ratio: 7 — ABNORMAL LOW (ref 9–23)
BUN: 8 mg/dL (ref 6–24)
Bilirubin Total: 0.4 mg/dL (ref 0.0–1.2)
CO2: 25 mmol/L (ref 20–29)
Calcium: 9.3 mg/dL (ref 8.7–10.2)
Chloride: 101 mmol/L (ref 96–106)
Creatinine, Ser: 1.08 mg/dL — ABNORMAL HIGH (ref 0.57–1.00)
Globulin, Total: 2.7 g/dL (ref 1.5–4.5)
Glucose: 80 mg/dL (ref 70–99)
Potassium: 4.5 mmol/L (ref 3.5–5.2)
Sodium: 140 mmol/L (ref 134–144)
Total Protein: 7.1 g/dL (ref 6.0–8.5)
eGFR: 64 mL/min/{1.73_m2} (ref >59)

## 2021-10-17 LAB — LIPID PANEL WITH LDL/HDL RATIO
Cholesterol, Total: 170 mg/dL (ref 100–199)
HDL: 69 mg/dL (ref >39)
LDL Chol Calc (NIH): 85 mg/dL (ref 0–99)
LDL/HDL Ratio: 1.2 ratio (ref 0.0–3.2)
Triglycerides: 84 mg/dL (ref 0–149)
VLDL Cholesterol Cal: 16 mg/dL (ref 5–40)

## 2021-10-17 LAB — TSH: TSH: 2.92 u[IU]/mL (ref 0.450–4.500)

## 2021-10-19 ENCOUNTER — Other Ambulatory Visit: Payer: Self-pay

## 2021-10-19 ENCOUNTER — Telehealth: Payer: Self-pay

## 2021-10-19 DIAGNOSIS — Z1211 Encounter for screening for malignant neoplasm of colon: Secondary | ICD-10-CM

## 2021-10-19 MED ORDER — NA SULFATE-K SULFATE-MG SULF 17.5-3.13-1.6 GM/177ML PO SOLN: 354.0000 mL | Freq: Once | ORAL | 0 refills | Status: AC

## 2021-10-19 NOTE — Telephone Encounter (Signed)
Gastroenterology Pre-Procedure Review  Request Date: 11/17/2021 Requesting Physician: Dr. Marius Ditch  PATIENT REVIEW QUESTIONS: The patient responded to the following health history questions as indicated:    1. Are you having any GI issues? no 2. Do you have a personal history of Polyps? no 3. Do you have a family history of Colon Cancer or Polyps? no 4. Diabetes Mellitus? no 5. Joint replacements in the past 12 months?no 6. Major health problems in the past 3 months?no 7. Any artificial heart valves, MVP, or defibrillator?no    MEDICATIONS & ALLERGIES:    Patient reports the following regarding taking any anticoagulation/antiplatelet therapy:   Plavix, Coumadin, Eliquis, Xarelto, Lovenox, Pradaxa, Brilinta, or Effient? no Aspirin? no  Patient confirms/reports the following medications:  Current Outpatient Medications  Medication Sig Dispense Refill   atorvastatin (LIPITOR) 10 MG tablet Take 1 tablet by mouth daily 90 tablet 1   buPROPion (WELLBUTRIN XL) 300 MG 24 hr tablet Take 1 tablet by mouth daily. [facility]     estradiol (ESTRACE) 2 MG tablet Take 1 tablet by mouth daily. Dr Shift/ [facility]     FLUoxetine (PROZAC) 10 MG capsule Take 1 capsule (10 mg total) by mouth daily. 20+ 10=30 mg daily (Patient taking differently: Take 10 mg by mouth daily. 20+ 10=30 mg daily/ [facility]) 90 capsule 1   FLUoxetine (PROZAC) 20 MG tablet Take 1 tablet (20 mg total) by mouth daily. 90 tablet 1   levothyroxine (SYNTHROID) 25 MCG tablet Take 1 tablet by mouth daily 90 tablet 1   loratadine (CLARITIN) 10 MG tablet Take 10 mg by mouth daily. otc     Multiple Vitamins-Minerals (MULTIVITAMIN GUMMIES ADULT) CHEW Chew 2 each by mouth daily. otc (Patient not taking: Reported on 04/17/2021)     mupirocin ointment (BACTROBAN) 2 % Apply 1 application topically 2 (two) times daily. 22 g 0   triamcinolone cream (KENALOG) 0.1 % Apply 1 application topically 2 (two) times daily. 30 g 0   No current  facility-administered medications for this visit.    Patient confirms/reports the following allergies:  Allergies  Allergen Reactions   Codeine    Other     dermabond-hives   Latex Itching    No orders of the defined types were placed in this encounter.   AUTHORIZATION INFORMATION Primary Insurance: 1D#: Group #:  Secondary Insurance: 1D#: Group #:  SCHEDULE INFORMATION: Date:  Time: Location:

## 2021-11-11 ENCOUNTER — Inpatient Hospital Stay
Admission: RE | Admit: 2021-11-11 | Discharge: 2021-11-11 | Disposition: A | Discharge status: Home / Self Care | Payer: Self-pay | Source: Ambulatory Visit | Attending: *Deleted | Admitting: *Deleted

## 2021-11-11 ENCOUNTER — Other Ambulatory Visit: Payer: Self-pay | Admitting: *Deleted

## 2021-11-11 ENCOUNTER — Ambulatory Visit
Admission: RE | Admit: 2021-11-11 | Discharge: 2021-11-11 | Disposition: A | Discharge status: Home / Self Care | Payer: BC Managed Care – PPO | Source: Ambulatory Visit | Attending: Family Medicine | Admitting: Family Medicine

## 2021-11-11 DIAGNOSIS — Z1231 Encounter for screening mammogram for malignant neoplasm of breast: Secondary | ICD-10-CM | POA: Diagnosis present

## 2021-11-17 ENCOUNTER — Encounter: Payer: Self-pay | Admitting: Gastroenterology

## 2021-11-17 ENCOUNTER — Ambulatory Visit: Payer: BC Managed Care – PPO | Admitting: Certified Registered Nurse Anesthetist

## 2021-11-17 ENCOUNTER — Other Ambulatory Visit: Payer: Self-pay

## 2021-11-17 ENCOUNTER — Encounter
Admission: RE | Disposition: A | Discharge status: Home / Self Care | Payer: Self-pay | Source: Ambulatory Visit | Attending: Gastroenterology

## 2021-11-17 ENCOUNTER — Ambulatory Visit
Admission: RE | Admit: 2021-11-17 | Discharge: 2021-11-17 | Disposition: A | Discharge status: Home / Self Care | Payer: BC Managed Care – PPO | Source: Ambulatory Visit | Attending: Gastroenterology | Admitting: Gastroenterology

## 2021-11-17 DIAGNOSIS — E039 Hypothyroidism, unspecified: Secondary | ICD-10-CM | POA: Diagnosis not present

## 2021-11-17 DIAGNOSIS — Z1211 Encounter for screening for malignant neoplasm of colon: Secondary | ICD-10-CM

## 2021-11-17 HISTORY — PX: COLONOSCOPY WITH PROPOFOL: SHX5780

## 2021-11-17 SURGERY — COLONOSCOPY WITH PROPOFOL
Anesthesia: General

## 2021-11-17 MED ORDER — DEXMEDETOMIDINE HCL IN NACL 80 MCG/20ML IV SOLN
INTRAVENOUS | Status: DC | PRN
  Administered 2021-11-17 (×2): 4 ug via BUCCAL

## 2021-11-17 MED ORDER — LIDOCAINE HCL (CARDIAC) PF 100 MG/5ML IV SOSY
PREFILLED_SYRINGE | INTRAVENOUS | Status: DC | PRN
  Administered 2021-11-17: 50 mg via INTRAVENOUS

## 2021-11-17 MED ORDER — PROPOFOL 10 MG/ML IV BOLUS
INTRAVENOUS | Status: DC | PRN
  Administered 2021-11-17: 70 mg via INTRAVENOUS

## 2021-11-17 MED ORDER — PROPOFOL 500 MG/50ML IV EMUL
INTRAVENOUS | Status: DC | PRN
  Administered 2021-11-17: 140 ug/kg/min via INTRAVENOUS

## 2021-11-17 MED ORDER — SODIUM CHLORIDE 0.9 % IV SOLN: INTRAVENOUS | Status: DC

## 2021-11-17 NOTE — Anesthesia Postprocedure Evaluation (Signed)
Anesthesia Post Note  Patient: Emily Sawyer  Procedure(s) Performed: COLONOSCOPY WITH PROPOFOL  Patient location during evaluation: Endoscopy Anesthesia Type: General Level of consciousness: awake and alert Pain management: pain level controlled Vital Signs Assessment: post-procedure vital signs reviewed and stable Respiratory status: spontaneous breathing, nonlabored ventilation, respiratory function stable and patient connected to nasal cannula oxygen Cardiovascular status: blood pressure returned to baseline and stable Postop Assessment: no apparent nausea or vomiting Anesthetic complications: no   No notable events documented.   Last Vitals:  Vitals:   11/17/21 1035 11/17/21 1045  BP: 104/61 104/63  Pulse: 61 66  Resp: 15 13  Temp:    SpO2: 97% 99%    Last Pain:  Vitals:   11/17/21 1045  TempSrc:   PainSc: 0-No pain                 Ilene Qua

## 2021-11-17 NOTE — Op Note (Signed)
[facility] Gastroenterology Patient Name: Emily Sawyer Procedure Date: 11/17/2021 9:52 AM MRN: [id number] Account #: [id number] Date of Birth: October 08, 1975 Admit Type: Outpatient Age: 46 Room: [facility] ENDO ROOM 3 Gender: Female Note Status: Finalized Instrument Name: Jasper Riling [id number] Procedure:             Colonoscopy Indications:           Screening for colorectal malignant neoplasm, This is                         the patient's first colonoscopy Providers:             Lin Landsman MD, MD Referring MD:          Juline Patch, MD (Referring MD) Medicines:             General Anesthesia Complications:         No immediate complications. Estimated blood loss: None. Procedure:             Pre-Anesthesia Assessment:                        - Prior to the procedure, a History and Physical was                         performed, and patient medications and allergies were                         reviewed. The patient is competent. The risks and                         benefits of the procedure and the sedation options and                         risks were discussed with the patient. All questions                         were answered and informed consent was obtained.                         Patient identification and proposed procedure were                         verified by the physician, the nurse, the                         anesthesiologist, the anesthetist and the technician                         in the pre-procedure area in the procedure room in the                         endoscopy suite. Mental Status Examination: alert and                         oriented. Airway Examination: normal oropharyngeal                         airway and neck mobility. Respiratory Examination:  clear to auscultation. CV Examination: normal.                         Prophylactic Antibiotics: The patient does not require                         prophylactic  antibiotics. Prior Anticoagulants: The                         patient has taken no previous anticoagulant or                         antiplatelet agents. ASA Grade Assessment: II - A                         patient with mild systemic disease. After reviewing                         the risks and benefits, the patient was deemed in                         satisfactory condition to undergo the procedure. The                         anesthesia plan was to use general anesthesia.                         Immediately prior to administration of medications,                         the patient was re-assessed for adequacy to receive                         sedatives. The heart rate, respiratory rate, oxygen                         saturations, blood pressure, adequacy of pulmonary                         ventilation, and response to care were monitored                         throughout the procedure. The physical status of the                         patient was re-assessed after the procedure.                        After obtaining informed consent, the colonoscope was                         passed under direct vision. Throughout the procedure,                         the patient's blood pressure, pulse, and oxygen                         saturations were monitored continuously. The  Colonoscope was introduced through the anus and                         advanced to the the cecum, identified by appendiceal                         orifice and ileocecal valve. The colonoscopy was                         performed without difficulty. The patient tolerated                         the procedure well. The quality of the bowel                         preparation was evaluated using the BBPS [facility] Bowel                         Preparation Scale) with scores of: Right Colon = 3,                         Transverse Colon = 3 and Left Colon = 3 (entire mucosa                          seen well with no residual staining, small fragments                         of stool or opaque liquid). The total BBPS score                         equals 9. Findings:      The perianal and digital rectal examinations were normal. Pertinent       negatives include normal sphincter tone and no palpable rectal lesions.      The entire examined colon appeared normal.      The retroflexed view of the distal rectum and anal verge was normal and       showed no anal or rectal abnormalities. Impression:            - The entire examined colon is normal.                        - The distal rectum and anal verge are normal on                         retroflexion view.                        - No specimens collected. Recommendation:        - Discharge patient to home (with escort).                        - Resume previous diet today.                        - Continue present medications.                        - Repeat colonoscopy in 10 years  for screening                         purposes. Procedure Code(s):     --- Professional ---                        V0131, Colorectal cancer screening; colonoscopy on                         individual not meeting criteria for high risk Diagnosis Code(s):     --- Professional ---                        Z12.11, Encounter for screening for malignant neoplasm                         of colon CPT copyright 2019 American Medical Association. All rights reserved. The codes documented in this report are preliminary and upon coder review may  be revised to meet current compliance requirements. Dr. Ulyess Mort Lin Landsman MD, MD 11/17/2021 10:22:48 AM This report has been signed electronically. Number of Addenda: 0 Note Initiated On: 11/17/2021 9:52 AM Scope Withdrawal Time: 0 hours 9 minutes 2 seconds  Total Procedure Duration: 0 hours 14 minutes 1 second  Estimated Blood Loss:  Estimated blood loss: none. Estimated blood loss: none.      [facility]

## 2021-11-17 NOTE — H&P (Signed)
Emily Darby, MD [address]  [place]  [place], [place] [address]  Main: [phone]  Fax: [phone] Pager: [phone]  Primary Care Physician:  Juline Patch, MD Primary Gastroenterologist:  Dr. Cephas Sawyer  Pre-Procedure History & Physical: HPI:  Alnita Aybar is a 46 y.o. female is here for an colonoscopy.   Past Medical History:  Diagnosis Date   Depression    Endometriosis    Thyroid disease    T4 low    Past Surgical History:  Procedure Laterality Date   GALLBLADDER SURGERY     LAPAROSCOPY     TOTAL ABDOMINAL HYSTERECTOMY      Prior to Admission medications   Medication Sig Start Date End Date Taking? Authorizing Provider  atorvastatin (LIPITOR) 10 MG tablet Take 1 tablet by mouth daily 10/16/21   Juline Patch, MD  buPROPion (WELLBUTRIN XL) 300 MG 24 hr tablet Take 1 tablet by mouth daily. [facility] psych 08/16/20 10/16/21  [provider]  estradiol (ESTRACE) 2 MG tablet Take 1 tablet by mouth daily. Dr Shift/ [facility] 04/10/20 10/16/21  [provider]  FLUoxetine (PROZAC) 10 MG capsule Take 1 capsule (10 mg total) by mouth daily. 20+ 10=30 mg daily Patient taking differently: Take 10 mg by mouth daily. 20+ 10=30 mg daily/ [facility] psych 04/15/20   Juline Patch, MD  FLUoxetine (PROZAC) 20 MG tablet Take 1 tablet (20 mg total) by mouth daily. 04/15/20   Juline Patch, MD  levothyroxine (SYNTHROID) 25 MCG tablet Take 1 tablet by mouth daily 10/16/21   Juline Patch, MD  loratadine (CLARITIN) 10 MG tablet Take 10 mg by mouth daily. otc    [provider]  Multiple Vitamins-Minerals (MULTIVITAMIN GUMMIES ADULT) CHEW Chew 2 each by mouth daily. otc Patient not taking: Reported on 04/17/2021    [provider]  mupirocin ointment (BACTROBAN) 2 % Apply 1 application topically 2 (two) times daily. 08/22/18   Juline Patch, MD  triamcinolone cream (KENALOG) 0.1 % Apply 1 application topically 2 (two) times daily. 05/19/18   Juline Patch, MD    Allergies as of 10/19/2021 - Review Complete 10/16/2021  Allergen Reaction Noted   Codeine  12/31/2013   Other  02/21/2017   Latex Itching 10/18/2016    Family History  Problem Relation Age of Onset   Hypertension Mother    Glaucoma Mother    Hypertension Father    Cancer Father    Diabetes Brother    Hypertension Brother    Cancer Paternal Grandmother    Heart disease Paternal Grandfather     Social History   Socioeconomic History   Marital status: Married    Spouse name: Not on file   Number of children: Not on file   Years of education: Not on file   Highest education level: Not on file  Occupational History   Not on file  Tobacco Use   Smoking status: Never   Smokeless tobacco: Never  Vaping Use   Vaping Use: Never used  Substance and Sexual Activity   Alcohol use: Yes    Comment: rare   Drug use: No   Sexual activity: Yes    Birth control/protection: None  Other Topics Concern   Not on file  Social History Narrative   Not on file   Social Determinants of Health   Financial Resource Strain: Not on file  Food Insecurity: Not on file  Transportation Needs: Not on file  Physical Activity:  Not on file  Stress: Not on file  Social Connections: Not on file  Intimate Partner Violence: Not on file    Review of Systems: See HPI, otherwise negative ROS  Physical Exam: BP 117/73   Pulse 62   Temp (!) 96.9 F (36.1 C) (Temporal)   Resp 16   Ht 5' 6.5" (1.689 m)   Wt 93 kg   LMP 08/22/2016   SpO2 99%   BMI 32.59 kg/m  General:   Alert,  pleasant and cooperative in NAD Head:  Normocephalic and atraumatic. Neck:  Supple; no masses or thyromegaly. Lungs:  Clear throughout to auscultation.    Heart:  Regular rate and rhythm. Abdomen:  Soft, nontender and nondistended. Normal bowel sounds, without guarding, and without rebound.   Neurologic:  Alert and  oriented x4;  grossly normal neurologically.  Impression/Plan: Windy Dudek is  here for an colonoscopy to be performed for colon cancer screening  Risks, benefits, limitations, and alternatives regarding  colonoscopy have been reviewed with the patient.  Questions have been answered.  All parties agreeable.   Sherri Sear, MD  11/17/2021, 10:01 AM

## 2021-11-17 NOTE — Anesthesia Preprocedure Evaluation (Signed)
Anesthesia Evaluation  Patient identified by MRN, date of birth, ID band Patient awake    Reviewed: Allergy & Precautions, NPO status , Patient's Chart, lab work & pertinent test results  History of Anesthesia Complications (+) PONV and history of anesthetic complications  Airway Mallampati: II  TM Distance: >3 FB Neck ROM: Full    Dental no notable dental hx.    Pulmonary neg pulmonary ROS,    Pulmonary exam normal breath sounds clear to auscultation       Cardiovascular Exercise Tolerance: Good negative cardio ROS Normal cardiovascular exam Rhythm:Regular Rate:Normal     Neuro/Psych PSYCHIATRIC DISORDERS Depression negative neurological ROS     GI/Hepatic negative GI ROS, Neg liver ROS,   Endo/Other  Hypothyroidism   Renal/GU negative Renal ROS  negative genitourinary   Musculoskeletal negative musculoskeletal ROS (+)   Abdominal   Peds negative pediatric ROS (+)  Hematology negative hematology ROS (+)   Anesthesia Other Findings   Reproductive/Obstetrics negative OB ROS                             Anesthesia Physical Anesthesia Plan  ASA: 2  Anesthesia Plan: General   Post-op Pain Management: Minimal or no pain anticipated   Induction: Intravenous  PONV Risk Score and Plan: 3 and Propofol infusion and TIVA  Airway Management Planned: Natural Airway and Nasal Cannula  Additional Equipment:   Intra-op Plan:   Post-operative Plan:   Informed Consent: I have reviewed the patients History and Physical, chart, labs and discussed the procedure including the risks, benefits and alternatives for the proposed anesthesia with the patient or authorized representative who has indicated his/her understanding and acceptance.     Dental Advisory Given  Plan Discussed with: Anesthesiologist, CRNA and Surgeon  Anesthesia Plan Comments: (Patient consented for risks of anesthesia  including but not limited to:  - adverse reactions to medications - risk of airway placement if required - damage to eyes, teeth, lips or other oral mucosa - nerve damage due to positioning  - sore throat or hoarseness - Damage to heart, brain, nerves, lungs, other parts of body or loss of life  Patient voiced understanding.)        Anesthesia Quick Evaluation

## 2021-11-17 NOTE — Anesthesia Procedure Notes (Signed)
Date/Time: 11/17/2021 10:02 AM  Performed by: Lily Peer, Millissa Deese, CRNAPre-anesthesia Checklist: Patient identified, Emergency Drugs available, Suction available, Patient being monitored and Timeout performed Patient Re-evaluated:Patient Re-evaluated prior to induction Oxygen Delivery Method: Nasal cannula Induction Type: IV induction

## 2021-11-17 NOTE — Transfer of Care (Addendum)
Immediate Anesthesia Transfer of Care Note  Patient: Emily Sawyer  Procedure(s) Performed: COLONOSCOPY WITH PROPOFOL  Patient Location: PACU  Anesthesia Type:General  Level of Consciousness: drowsy  Airway & Oxygen Therapy: Patient Spontanous Breathing  Post-op Assessment: Report given to RN and Post -op Vital signs reviewed and stable  Post vital signs: Reviewed and stable  Last Vitals:  Vitals Value Taken Time  BP 101/55 11/17/21 1025  Temp 36.4 C 11/17/21 1025  Pulse 61 11/17/21 1033  Resp 14 11/17/21 1033  SpO2 97 % 11/17/21 1033  Vitals shown include unvalidated device data.  Last Pain:  Vitals:   11/17/21 1025  TempSrc: Temporal  PainSc: Asleep         Complications: No notable events documented.

## 2021-11-18 ENCOUNTER — Encounter: Payer: Self-pay | Admitting: Gastroenterology

## 2022-01-12 ENCOUNTER — Encounter: Payer: Self-pay | Admitting: Family Medicine

## 2022-04-20 ENCOUNTER — Ambulatory Visit: Payer: BC Managed Care – PPO | Admitting: Family Medicine

## 2022-05-11 ENCOUNTER — Encounter: Payer: Self-pay | Admitting: Family Medicine

## 2022-05-11 ENCOUNTER — Ambulatory Visit: Payer: BC Managed Care – PPO | Admitting: Family Medicine

## 2022-05-11 DIAGNOSIS — E034 Atrophy of thyroid (acquired): Secondary | ICD-10-CM

## 2022-05-11 DIAGNOSIS — E782 Mixed hyperlipidemia: Secondary | ICD-10-CM | POA: Diagnosis not present

## 2022-05-11 MED ORDER — ATORVASTATIN CALCIUM 10 MG PO TABS: ORAL_TABLET | ORAL | 1 refills | Status: DC

## 2022-05-11 MED ORDER — LEVOTHYROXINE SODIUM 25 MCG PO TABS: ORAL_TABLET | ORAL | 1 refills | Status: DC

## 2022-05-11 NOTE — Progress Notes (Signed)
Date:  05/11/2022   Name:  Emily Sawyer   DOB:  1975/07/03   MRN:  [id number]   Chief Complaint: Hyperlipidemia and Hypothyroidism  Hyperlipidemia This is a chronic problem. The current episode started more than 1 year ago. The problem is controlled. Recent lipid tests were reviewed and are normal. She has no history of chronic renal disease, diabetes, hypothyroidism or liver disease. There are no known factors aggravating her hyperlipidemia. Pertinent negatives include no chest pain, myalgias or shortness of breath. Current antihyperlipidemic treatment includes statins. The current treatment provides moderate improvement of lipids. There are no compliance problems.  Risk factors for coronary artery disease include dyslipidemia and hypertension.  Thyroid Problem Presents for follow-up visit. Patient reports no anxiety, cold intolerance, constipation, depressed mood, diarrhea, fatigue, hair loss, leg swelling, palpitations, weight gain or weight loss. Her past medical history is significant for hyperlipidemia. There is no history of diabetes.    Lab Results  Component Value Date   NA 140 10/16/2021   K 4.5 10/16/2021   CO2 25 10/16/2021   GLUCOSE 80 10/16/2021   BUN 8 10/16/2021   CREATININE 1.08 (H) 10/16/2021   CALCIUM 9.3 10/16/2021   EGFR 64 10/16/2021   GFRNONAA 84 04/16/2020   Lab Results  Component Value Date   CHOL 170 10/16/2021   HDL 69 10/16/2021   LDLCALC 85 10/16/2021   TRIG 84 10/16/2021   CHOLHDL 4.8 (H) 06/30/2018   Lab Results  Component Value Date   TSH 2.920 10/16/2021   No results found for: "HGBA1C" Lab Results  Component Value Date   WBC 7.0 06/30/2018   HGB 14.3 06/30/2018   HCT 42.1 06/30/2018   MCV 92 06/30/2018   PLT 278 06/30/2018   Lab Results  Component Value Date   ALT 16 10/16/2021   AST 22 10/16/2021   ALKPHOS 81 10/16/2021   BILITOT 0.4 10/16/2021   No results found for: "25OHVITD2", "25OHVITD3", "VD25OH"   Review of Systems   Constitutional: Negative.  Negative for chills, fatigue, fever, unexpected weight change, weight gain and weight loss.  HENT:  Negative for congestion, ear discharge, ear pain, rhinorrhea, sinus pressure, sneezing and sore throat.   Respiratory:  Negative for cough, shortness of breath, wheezing and stridor.   Cardiovascular:  Negative for chest pain and palpitations.  Gastrointestinal:  Negative for abdominal pain, blood in stool, constipation, diarrhea and nausea.  Endocrine: Negative for cold intolerance.  Genitourinary:  Negative for dysuria, flank pain, frequency, hematuria, urgency and vaginal discharge.  Musculoskeletal:  Negative for arthralgias, back pain and myalgias.  Skin:  Negative for rash.  Neurological:  Negative for dizziness, weakness and headaches.  Hematological:  Negative for adenopathy. Does not bruise/bleed easily.  Psychiatric/Behavioral:  Negative for dysphoric mood. The patient is not nervous/anxious.     Patient Active Problem List   Diagnosis Date Noted   Screening for colon cancer    Depression 06/04/2021   Thyroid activity decreased 03/01/2018   Status post total hysterectomy 02/10/2017   Dysmenorrhea 09/29/2016   Menorrhagia with regular cycle 09/29/2016   Endometriosis 09/20/2016   Endometrioma of ovary 09/20/2016    Allergies  Allergen Reactions   Codeine    Other     dermabond-hives   Latex Itching    Past Surgical History:  Procedure Laterality Date   COLONOSCOPY WITH PROPOFOL N/A 11/17/2021   Procedure: COLONOSCOPY WITH PROPOFOL;  Surgeon: Lin Landsman, MD;  Location: [place];  Service: Gastroenterology;  Laterality: N/A;  GALLBLADDER SURGERY     LAPAROSCOPY     TOTAL ABDOMINAL HYSTERECTOMY      Social History   Tobacco Use   Smoking status: Never   Smokeless tobacco: Never  Vaping Use   Vaping Use: Never used  Substance Use Topics   Alcohol use: Yes    Comment: rare   Drug use: No     Medication list has  been reviewed and updated.  Current Meds  Medication Sig   atorvastatin (LIPITOR) 10 MG tablet Take 1 tablet by mouth daily   buPROPion (WELLBUTRIN XL) 300 MG 24 hr tablet Take 1 tablet by mouth daily. [facility] psych   estradiol (ESTRACE) 2 MG tablet Take 1 tablet by mouth daily. Dr Shift/ [facility]   FLUoxetine (PROZAC) 20 MG tablet Take 1 tablet (20 mg total) by mouth daily.   levothyroxine (SYNTHROID) 25 MCG tablet Take 1 tablet by mouth daily   mupirocin ointment (BACTROBAN) 2 % Apply 1 application topically 2 (two) times daily.   triamcinolone cream (KENALOG) 0.1 % Apply 1 application topically 2 (two) times daily.       05/11/2022    8:26 AM 10/16/2021    7:56 AM 04/17/2021    8:09 AM 10/15/2020    8:30 AM  GAD 7 : Generalized Anxiety Score  Nervous, Anxious, on Edge 0 0 0 1  Control/stop worrying 0 0 0 0  Worry too much - different things 0 0 0 0  Trouble relaxing 0 0 0 0  Restless 0 0 2 1  Easily annoyed or irritable 0 0 0 0  Afraid - awful might happen 0 0 0 0  Total GAD 7 Score 0 0 2 2  Anxiety Difficulty Not difficult at all Not difficult at all Not difficult at all Not difficult at all       05/11/2022    8:26 AM 10/16/2021    7:56 AM 04/17/2021    8:08 AM  Depression screen PHQ 2/9  Decreased Interest 0 0 0  Down, Depressed, Hopeless 0 0 1  PHQ - 2 Score 0 0 1  Altered sleeping 0 0 2  Tired, decreased energy 0 0 2  Change in appetite 0 0 0  Feeling bad or failure about yourself  0 0 0  Trouble concentrating 0 0 0  Moving slowly or fidgety/restless 0 0 1  Suicidal thoughts 0 0 0  PHQ-9 Score 0 0 6  Difficult doing work/chores Not difficult at all Not difficult at all Not difficult at all    BP Readings from Last 3 Encounters:  05/11/22 122/82  11/17/21 104/63  10/16/21 110/80    Physical Exam Vitals and nursing note reviewed. Exam conducted with a chaperone present.  Constitutional:      General: She is not in acute distress.    Appearance: She is not  diaphoretic.  HENT:     Head: Normocephalic and atraumatic.     Right Ear: Tympanic membrane and external ear normal.     Left Ear: Tympanic membrane and external ear normal.     Nose: Nose normal. No congestion or rhinorrhea.     Mouth/Throat:     Mouth: Mucous membranes are moist.  Eyes:     General:        Right eye: No discharge.        Left eye: No discharge.     Conjunctiva/sclera: Conjunctivae normal.     Pupils: Pupils are equal, round, and reactive to light.  Neck:     Thyroid: No thyromegaly.     Vascular: No JVD.  Cardiovascular:     Rate and Rhythm: Normal rate and regular rhythm.     Heart sounds: Normal heart sounds. No murmur heard.    No friction rub. No gallop.  Pulmonary:     Effort: Pulmonary effort is normal.     Breath sounds: Normal breath sounds. No stridor. No wheezing or rhonchi.  Chest:     Chest wall: No tenderness.  Abdominal:     General: Bowel sounds are normal.     Palpations: Abdomen is soft. There is no mass.     Tenderness: There is no abdominal tenderness. There is no guarding or rebound.  Musculoskeletal:        General: Normal range of motion.     Cervical back: Normal range of motion and neck supple.  Lymphadenopathy:     Cervical: No cervical adenopathy.  Skin:    General: Skin is warm and dry.  Neurological:     Mental Status: She is alert.     Wt Readings from Last 3 Encounters:  05/11/22 207 lb (93.9 kg)  11/17/21 205 lb (93 kg)  10/16/21 197 lb (89.4 kg)    BP 122/82   Pulse 79   Ht 5' 6.5" (1.689 m)   Wt 207 lb (93.9 kg)   LMP 08/22/2016   SpO2 95%   BMI 32.91 kg/m   Assessment and Plan:  1. Mixed hyperlipidemia Chronic.  Controlled.  Stable.  Continue dietary approach to controlling cholesterol as well as atorvastatin 10 mg once a day.  Will check renal function panel for electrolytes and GFR.  Will monitor for 6 months. - atorvastatin (LIPITOR) 10 MG tablet; Take 1 tablet by mouth daily  Dispense: 90 tablet;  Refill: 1 - Renal Function Panel  2. Hypothyroidism due to acquired atrophy of thyroid .  Controlled.  Stable.  Pending TSH we will likely continue levothyroxine 25 mcg daily will recheck in 6 months. - levothyroxine (SYNTHROID) 25 MCG tablet; Take 1 tablet by mouth daily  Dispense: 90 tablet; Refill: 1 - Renal Function Panel - TSH    Otilio Miu, MD

## 2022-05-12 LAB — RENAL FUNCTION PANEL
Albumin: 4.4 g/dL (ref 3.9–4.9)
BUN/Creatinine Ratio: 12 (ref 9–23)
BUN: 11 mg/dL (ref 6–24)
CO2: 21 mmol/L (ref 20–29)
Calcium: 9.5 mg/dL (ref 8.7–10.2)
Chloride: 103 mmol/L (ref 96–106)
Creatinine, Ser: 0.91 mg/dL (ref 0.57–1.00)
Glucose: 84 mg/dL (ref 70–99)
Phosphorus: 2.7 mg/dL — ABNORMAL LOW (ref 3.0–4.3)
Potassium: 5.1 mmol/L (ref 3.5–5.2)
Sodium: 141 mmol/L (ref 134–144)
eGFR: 78 mL/min/{1.73_m2} (ref >59)

## 2022-05-12 LAB — TSH: TSH: 3.29 u[IU]/mL (ref 0.450–4.500)

## 2022-11-19 ENCOUNTER — Ambulatory Visit: Payer: BC Managed Care – PPO | Admitting: Family Medicine

## 2022-11-19 ENCOUNTER — Encounter: Payer: Self-pay | Admitting: Family Medicine

## 2022-11-19 VITALS — BP 110/64 | HR 72 | Ht 66.5 in | Wt 212.0 lb

## 2022-11-19 DIAGNOSIS — E034 Atrophy of thyroid (acquired): Secondary | ICD-10-CM | POA: Diagnosis not present

## 2022-11-19 DIAGNOSIS — E782 Mixed hyperlipidemia: Secondary | ICD-10-CM | POA: Diagnosis not present

## 2022-11-19 DIAGNOSIS — L811 Chloasma: Secondary | ICD-10-CM | POA: Diagnosis not present

## 2022-11-19 MED ORDER — LEVOTHYROXINE SODIUM 25 MCG PO TABS: ORAL_TABLET | ORAL | 1 refills | Status: DC

## 2022-11-19 MED ORDER — ATORVASTATIN CALCIUM 10 MG PO TABS: ORAL_TABLET | ORAL | 1 refills | Status: DC

## 2022-11-19 NOTE — Patient Instructions (Signed)
Melasma  Melasma is a skin condition that causes areas of darker coloring. It often appears in patches on the cheeks, forehead, upper lip, and neck. These patches can look like a mask. The discolored areas do not itch and are not red or swollen. Melasma does not spread from person to person (is not contagious). What are the causes? The cause of this condition is not known. It can be started by certain triggers, such as: Being out in the sun. Allergies to medicines or cosmetics, such as makeup or face creams. Changes in your hormones. These may come from: Taking birth control medicines. Taking hormone replacement therapy. Being pregnant. What increases the risk? You may be more likely to develop this condition if: You are female. You have a family history of this condition. You have darker skin. You live in a tropical area. What are the signs or symptoms? The only sign of this condition is dark or tan patches on the skin. The patches may appear darker than your skin color. In some cases, melasma may look like freckles. How is this diagnosed? This condition is diagnosed based on: A physical exam. Your health care provider will look at your skin. They may use a special light called a Wood lamp to examine your skin more closely. Biopsy. This is when a sample of your skin is removed and looked at under a microscope. This is done to make sure your melasma is not caused by another condition, such as skin cancer. How is this treated? There is no cure for this condition. Treatment may lighten the color of the patches. This may be done using: Medicines, such as bleaching or lightening creams. Facial chemical peels. Lasers. Dermabrasion or microdermabrasion. These procedures use tools to scrape and remove the outer layer of skin. New skin can grow in its place. Your melasma may also go away on its own over time. Follow these instructions at home:  General instructions Take or apply  over-the-counter and prescription medicines only as told by your health care provider. Avoid too much exposure to the sun, especially in tropical areas. Wear sunscreen with an SPF of 30 or higher every day. Wear a hat that protects your face from the sun. Use gentle cosmetics that are meant for sensitive skin. Do not use wax to remove hair in areas where you have or have had melasma. Contact a health care provider if: You have new symptoms. Your symptoms get worse. The discolored areas of your skin are bleeding or irritated. This information is not intended to replace advice given to you by your health care provider. Make sure you discuss any questions you have with your health care provider. Document Revised: 07/28/2021 Document Reviewed: 07/28/2021 Elsevier Patient Education  2024 [employer].

## 2022-11-19 NOTE — Progress Notes (Signed)
Date:  11/19/2022   Name:  Emily Sawyer   DOB:  1975-07-16   MRN:  [id number]   Chief Complaint: Hyperlipidemia and Hypothyroidism  Hyperlipidemia This is a chronic problem. The current episode started more than 1 year ago. The problem is controlled. Recent lipid tests were reviewed and are normal. Exacerbating diseases include hypothyroidism. She has no history of chronic renal disease, diabetes or liver disease. Pertinent negatives include no chest pain, focal sensory loss, focal weakness, leg pain, myalgias or shortness of breath. She is currently on no antihyperlipidemic treatment. The current treatment provides moderate improvement of lipids. There are no compliance problems.  Risk factors for coronary artery disease include dyslipidemia.  Thyroid Problem Presents for follow-up visit. Symptoms include weight gain. Patient reports no anxiety, cold intolerance, constipation, diaphoresis, diarrhea, dry skin, fatigue, hair loss, hoarse voice, leg swelling, menstrual problem, nail problem, palpitations, tremors or weight loss. Her past medical history is significant for hyperlipidemia. There is no history of diabetes.    Lab Results  Component Value Date   NA 141 05/11/2022   K 5.1 05/11/2022   CO2 21 05/11/2022   GLUCOSE 84 05/11/2022   BUN 11 05/11/2022   CREATININE 0.91 05/11/2022   CALCIUM 9.5 05/11/2022   EGFR 78 05/11/2022   GFRNONAA 84 04/16/2020   Lab Results  Component Value Date   CHOL 170 10/16/2021   HDL 69 10/16/2021   LDLCALC 85 10/16/2021   TRIG 84 10/16/2021   CHOLHDL 4.8 (H) 06/30/2018   Lab Results  Component Value Date   TSH 3.290 05/11/2022   No results found for: "HGBA1C" Lab Results  Component Value Date   WBC 7.0 06/30/2018   HGB 14.3 06/30/2018   HCT 42.1 06/30/2018   MCV 92 06/30/2018   PLT 278 06/30/2018   Lab Results  Component Value Date   ALT 16 10/16/2021   AST 22 10/16/2021   ALKPHOS 81 10/16/2021   BILITOT 0.4 10/16/2021   No  results found for: "25OHVITD2", "25OHVITD3", "VD25OH"   Review of Systems  Constitutional:  Positive for weight gain. Negative for chills, diaphoresis, fatigue, fever, unexpected weight change and weight loss.  HENT:  Negative for congestion, ear discharge, ear pain, hoarse voice, rhinorrhea, sinus pressure, sneezing and sore throat.   Respiratory:  Negative for cough, shortness of breath, wheezing and stridor.   Cardiovascular:  Negative for chest pain, palpitations and leg swelling.  Gastrointestinal:  Negative for abdominal pain, blood in stool, constipation, diarrhea and nausea.  Endocrine: Negative for cold intolerance.  Genitourinary:  Negative for dysuria, flank pain, frequency, hematuria, menstrual problem, urgency, vaginal bleeding and vaginal discharge.  Musculoskeletal:  Negative for arthralgias, back pain and myalgias.  Skin:  Negative for rash.  Neurological:  Negative for dizziness, tremors, focal weakness, weakness and headaches.  Hematological:  Negative for adenopathy. Does not bruise/bleed easily.  Psychiatric/Behavioral:  Negative for dysphoric mood. The patient is not nervous/anxious.     Patient Active Problem List   Diagnosis Date Noted   Screening for colon cancer    Depression 06/04/2021   Thyroid activity decreased 03/01/2018   Status post total hysterectomy 02/10/2017   Dysmenorrhea 09/29/2016   Menorrhagia with regular cycle 09/29/2016   Endometriosis 09/20/2016   Endometrioma of ovary 09/20/2016    Allergies  Allergen Reactions   Codeine    Other     dermabond-hives   Latex Itching    Past Surgical History:  Procedure Laterality Date   COLONOSCOPY WITH PROPOFOL  N/A 11/17/2021   Procedure: COLONOSCOPY WITH PROPOFOL;  Surgeon: Toney Reil, MD;  [facility] END[facility];  Service: Gastroenterology;  Laterality: N/A;   GALLBLADDER SURGERY     LAPAROSCOPY     TOTAL ABDOMINAL HYSTERECTOMY      Social History   Tobacco Use   Smoking status:  Never   Smokeless tobacco: Never  Vaping Use   Vaping status: Never Used  Substance Use Topics   Alcohol use: Yes    Comment: rare   Drug use: No     Medication list has been reviewed and updated.  Current Meds  Medication Sig   atorvastatin (LIPITOR) 10 MG tablet Take 1 tablet by mouth daily   buPROPion (WELLBUTRIN XL) 300 MG 24 hr tablet Take 1 tablet by mouth daily. [facility]   estradiol (ESTRACE) 2 MG tablet Take 1 tablet by mouth daily. Dr Shift/ [facility]   FLUoxetine (PROZAC) 20 MG tablet Take 1 tablet (20 mg total) by mouth daily. (Patient taking differently: Take 20 mg by mouth daily. [facility])   levothyroxine (SYNTHROID) 25 MCG tablet Take 1 tablet by mouth daily   loratadine (CLARITIN) 10 MG tablet Take 10 mg by mouth daily. otc       11/19/2022    8:24 AM 05/11/2022    8:26 AM 10/16/2021    7:56 AM 04/17/2021    8:09 AM  GAD 7 : Generalized Anxiety Score  Nervous, Anxious, on Edge 0 0 0 0  Control/stop worrying 0 0 0 0  Worry too much - different things 0 0 0 0  Trouble relaxing 0 0 0 0  Restless 0 0 0 2  Easily annoyed or irritable 0 0 0 0  Afraid - awful might happen 0 0 0 0  Total GAD 7 Score 0 0 0 2  Anxiety Difficulty Not difficult at all Not difficult at all Not difficult at all Not difficult at all       11/19/2022    8:24 AM 05/11/2022    8:26 AM 10/16/2021    7:56 AM  Depression screen PHQ 2/9  Decreased Interest 0 0 0  Down, Depressed, Hopeless 0 0 0  PHQ - 2 Score 0 0 0  Altered sleeping 0 0 0  Tired, decreased energy 0 0 0  Change in appetite 0 0 0  Feeling bad or failure about yourself  0 0 0  Trouble concentrating 0 0 0  Moving slowly or fidgety/restless 0 0 0  Suicidal thoughts 0 0 0  PHQ-9 Score 0 0 0  Difficult doing work/chores Not difficult at all Not difficult at all Not difficult at all    BP Readings from Last 3 Encounters:  11/19/22 110/64  05/11/22 122/82  11/17/21 104/63    Physical Exam Vitals and nursing note reviewed. Exam  conducted with a chaperone present.  Constitutional:      General: She is not in acute distress.    Appearance: She is not diaphoretic.  HENT:     Head: Normocephalic and atraumatic.     Right Ear: Tympanic membrane and external ear normal.     Left Ear: Tympanic membrane and external ear normal.     Nose: Nose normal. No congestion.     Mouth/Throat:     Pharynx: Oropharynx is clear.  Eyes:     General:        Right eye: No discharge.        Left eye: No discharge.  Conjunctiva/sclera: Conjunctivae normal.     Pupils: Pupils are equal, round, and reactive to light.  Neck:     Thyroid: No thyromegaly.     Vascular: No JVD.  Cardiovascular:     Rate and Rhythm: Normal rate and regular rhythm.     Heart sounds: Normal heart sounds. No murmur heard.    No friction rub. No gallop.  Pulmonary:     Effort: Pulmonary effort is normal.     Breath sounds: Normal breath sounds. No wheezing, rhonchi or rales.  Abdominal:     General: Bowel sounds are normal.     Palpations: Abdomen is soft. There is no mass.     Tenderness: There is no abdominal tenderness. There is no guarding.  Musculoskeletal:        General: Normal range of motion.     Cervical back: Normal range of motion and neck supple.  Lymphadenopathy:     Cervical: No cervical adenopathy.  Skin:    General: Skin is warm and dry.     Comments: Hyperpigmentation upper lip  Neurological:     Mental Status: She is alert.     Deep Tendon Reflexes: Reflexes are normal and symmetric.     Wt Readings from Last 3 Encounters:  11/19/22 212 lb (96.2 kg)  05/11/22 207 lb (93.9 kg)  11/17/21 205 lb (93 kg)    BP 110/64   Pulse 72   Ht 5' 6.5" (1.689 m)   Wt 212 lb (96.2 kg)   LMP 08/22/2016   SpO2 98%   BMI 33.71 kg/m   Assessment and Plan: 1. Mixed hyperlipidemia Chronic.  Controlled.  Stable.  Asymptomatic without myalgias.  Continue atorvastatin 10 mg once a day and we will check lipid panel for status of LDL  control and CMP for hepatic monitor. - atorvastatin (LIPITOR) 10 MG tablet; Take 1 tablet by mouth daily  Dispense: 90 tablet; Refill: 1 - Lipid Panel With LDL/HDL Ratio - Comprehensive Metabolic Panel (CMET)  2. Hypothyroidism due to acquired atrophy of thyroid Chronic.  Controlled.  Stable.  Currently tolerating and doing well on levothyroxine 25 mcg once a day however we will check thyroid panel with TSH to see if appropriate supplementation is achieved. - levothyroxine (SYNTHROID) 25 MCG tablet; Take 1 tablet by mouth daily  Dispense: 90 tablet; Refill: 1 - Thyroid Panel With TSH  3. Melasma New onset and that is brought to my attention with increased pigmentation of the upper lip. - Ambulatory referral to Dermatology     Elizabeth Sauer, MD

## 2022-11-20 ENCOUNTER — Encounter: Payer: Self-pay | Admitting: Family Medicine

## 2022-11-20 LAB — COMPREHENSIVE METABOLIC PANEL
ALT: 28 IU/L (ref 0–32)
AST: 25 IU/L (ref 0–40)
Albumin: 4.3 g/dL (ref 3.9–4.9)
Alkaline Phosphatase: 85 IU/L (ref 44–121)
BUN/Creatinine Ratio: 9 (ref 9–23)
BUN: 9 mg/dL (ref 6–24)
Bilirubin Total: 0.4 mg/dL (ref 0.0–1.2)
CO2: 24 mmol/L (ref 20–29)
Calcium: 9.5 mg/dL (ref 8.7–10.2)
Chloride: 101 mmol/L (ref 96–106)
Creatinine, Ser: 0.96 mg/dL (ref 0.57–1.00)
Globulin, Total: 2.5 g/dL (ref 1.5–4.5)
Glucose: 81 mg/dL (ref 70–99)
Potassium: 4.5 mmol/L (ref 3.5–5.2)
Sodium: 139 mmol/L (ref 134–144)
Total Protein: 6.8 g/dL (ref 6.0–8.5)
eGFR: 73 mL/min/{1.73_m2} (ref >59)

## 2022-11-20 LAB — THYROID PANEL WITH TSH
Free Thyroxine Index: 1.5 (ref 1.2–4.9)
T3 Uptake Ratio: 17 % — ABNORMAL LOW (ref 24–39)
T4, Total: 8.9 ug/dL (ref 4.5–12.0)
TSH: 2.47 u[IU]/mL (ref 0.450–4.500)

## 2022-11-20 LAB — LIPID PANEL WITH LDL/HDL RATIO
Cholesterol, Total: 198 mg/dL (ref 100–199)
HDL: 65 mg/dL (ref >39)
LDL Chol Calc (NIH): 107 mg/dL — ABNORMAL HIGH (ref 0–99)
LDL/HDL Ratio: 1.6 ratio (ref 0.0–3.2)
Triglycerides: 149 mg/dL (ref 0–149)
VLDL Cholesterol Cal: 26 mg/dL (ref 5–40)

## 2022-11-21 ENCOUNTER — Encounter: Payer: Self-pay | Admitting: Family Medicine

## 2022-11-23 ENCOUNTER — Encounter: Payer: Self-pay | Admitting: Family Medicine

## 2022-12-02 ENCOUNTER — Encounter: Payer: Self-pay | Admitting: Family Medicine

## 2022-12-03 ENCOUNTER — Encounter: Payer: Self-pay | Admitting: Physician Assistant

## 2022-12-03 ENCOUNTER — Ambulatory Visit: Payer: BC Managed Care – PPO | Admitting: Physician Assistant

## 2022-12-03 VITALS — BP 110/80 | HR 88 | Temp 98.2°F | Ht 66.5 in | Wt 213.0 lb

## 2022-12-03 DIAGNOSIS — J0110 Acute frontal sinusitis, unspecified: Secondary | ICD-10-CM

## 2022-12-03 MED ORDER — AMOXICILLIN-POT CLAVULANATE 875-125 MG PO TABS: 1.0000 | ORAL_TABLET | Freq: Two times a day (BID) | ORAL | 0 refills | Status: AC

## 2022-12-03 NOTE — Progress Notes (Signed)
Date:  12/03/2022   Name:  Emily Sawyer   DOB:  06-Dec-1975   MRN:  [id number]   Chief Complaint: Sinusitis (Had covid 1 week ago having side effects, cough with green mucous)  Sinusitis This is a new problem. Episode onset: X2 weeks. The problem has been rapidly worsening since onset. There has been no fever. Her pain is at a severity of 4/10. The pain is mild. Associated symptoms include congestion, coughing, headaches, sinus pressure and a sore throat. Pertinent negatives include no shortness of breath. Treatments tried: dayquil. The treatment provided mild relief.   Emily Sawyer is a very pleasant 47 year old female new to me today, typically sees my colleague Dr. Elizabeth Sauer, MD, here for evaluation of sinusitis on the back end of recovering from COVID.  She reports positive home COVID test 11 days ago, husband also had COVID.  She was recovering and feeling better, but then about 3 to 4 days ago she started having sinus congestion, postnasal drip, facial pressure which continues to worsen over the last 24 hours.  Currently using DayQuil at home and Mucinex occasionally.  History of allergic rhinitis for which she is taking Claritin, but says this does not feel like her allergies.   Medication list has been reviewed and updated.  Current Meds  Medication Sig   amoxicillin-clavulanate (AUGMENTIN) 875-125 MG tablet Take 1 tablet by mouth 2 (two) times daily for 5 days.   atorvastatin (LIPITOR) 10 MG tablet Take 1 tablet by mouth daily   buPROPion (WELLBUTRIN XL) 300 MG 24 hr tablet Take 1 tablet by mouth daily. [facility]   estradiol (ESTRACE) 2 MG tablet Take 1 tablet by mouth daily. Dr Shift/ [facility]   FLUoxetine (PROZAC) 20 MG tablet Take 1 tablet (20 mg total) by mouth daily. (Patient taking differently: Take 20 mg by mouth daily. [facility])   levothyroxine (SYNTHROID) 25 MCG tablet Take 1 tablet by mouth daily   loratadine (CLARITIN) 10 MG tablet Take 10 mg by mouth daily. otc   mupirocin ointment  (BACTROBAN) 2 % Apply 1 application topically 2 (two) times daily.   triamcinolone cream (KENALOG) 0.1 % Apply 1 application topically 2 (two) times daily.     Review of Systems  Constitutional:  Negative for fatigue and fever.  HENT:  Positive for congestion, sinus pressure, sinus pain and sore throat.   Respiratory:  Positive for cough. Negative for chest tightness and shortness of breath.   Cardiovascular:  Negative for chest pain and palpitations.  Gastrointestinal:  Negative for abdominal pain.  Neurological:  Positive for headaches.    Patient Active Problem List   Diagnosis Date Noted   Screening for colon cancer    Depression 06/04/2021   Thyroid activity decreased 03/01/2018   Status post total hysterectomy 02/10/2017   Dysmenorrhea 09/29/2016   Menorrhagia with regular cycle 09/29/2016   Endometriosis 09/20/2016   Endometrioma of ovary 09/20/2016    Allergies  Allergen Reactions   Cyanoacrylate Hives and Rash   Other Hives    dermabond-hives   Pollen Extract Other (See Comments)    Runny nose   Codeine Other (See Comments) and Nausea And Vomiting   Latex Itching    Immunization History  Administered Date(s) Administered   Influenza Inj Mdck Quad Pf 11/15/2019   Influenza Nasal 01/10/2017   Influenza,inj,Quad PF,6+ Mos 11/14/2018   Influenza-Unspecified 11/14/2019, 11/30/2021, 11/02/2022   PFIZER(Purple Top)SARS-COV-2 Vaccination 05/11/2019, 06/01/2019, 12/01/2019, 11/30/2021   PPD Test 10/22/2014   Pfizer(Comirnaty)Fall Seasonal Vaccine 12  years and older 11/02/2022   Tdap 02/21/2017    Past Surgical History:  Procedure Laterality Date   COLONOSCOPY WITH PROPOFOL N/A 11/17/2021   Procedure: COLONOSCOPY WITH PROPOFOL;  Surgeon: Toney Reil, MD;  Location: [facility] ENDOSCOPY;  Service: Gastroenterology;  Laterality: N/A;   GALLBLADDER SURGERY     LAPAROSCOPY     TOTAL ABDOMINAL HYSTERECTOMY      Social History   Tobacco Use   Smoking status:  Never   Smokeless tobacco: Never  Vaping Use   Vaping status: Never Used  Substance Use Topics   Alcohol use: Yes    Comment: rare   Drug use: No    Family History  Problem Relation Age of Onset   Hypertension Mother    Glaucoma Mother    Hypertension Father    Cancer Father    Diabetes Brother    Hypertension Brother    Cancer Paternal Grandmother    Heart disease Paternal Grandfather         12/03/2022   11:11 AM 11/19/2022    8:24 AM 05/11/2022    8:26 AM 10/16/2021    7:56 AM  GAD 7 : Generalized Anxiety Score  Nervous, Anxious, on Edge 1 0 0 0  Control/stop worrying 0 0 0 0  Worry too much - different things 0 0 0 0  Trouble relaxing 2 0 0 0  Restless 0 0 0 0  Easily annoyed or irritable 1 0 0 0  Afraid - awful might happen 0 0 0 0  Total GAD 7 Score 4 0 0 0  Anxiety Difficulty Not difficult at all Not difficult at all Not difficult at all Not difficult at all       12/03/2022   11:11 AM 11/19/2022    8:24 AM 05/11/2022    8:26 AM  Depression screen PHQ 2/9  Decreased Interest 1 0 0  Down, Depressed, Hopeless 0 0 0  PHQ - 2 Score 1 0 0  Altered sleeping 2 0 0  Tired, decreased energy 2 0 0  Change in appetite 1 0 0  Feeling bad or failure about yourself  0 0 0  Trouble concentrating 2 0 0  Moving slowly or fidgety/restless 1 0 0  Suicidal thoughts 0 0 0  PHQ-9 Score 9 0 0  Difficult doing work/chores Not difficult at all Not difficult at all Not difficult at all    BP Readings from Last 3 Encounters:  12/03/22 110/80  11/19/22 110/64  05/11/22 122/82    Wt Readings from Last 3 Encounters:  12/03/22 213 lb (96.6 kg)  11/19/22 212 lb (96.2 kg)  05/11/22 207 lb (93.9 kg)    BP 110/80   Pulse 88   Temp 98.2 F (36.8 C) (Oral)   Ht 5' 6.5" (1.689 m)   Wt 213 lb (96.6 kg)   LMP 08/22/2016   SpO2 98%   BMI 33.86 kg/m   Physical Exam Vitals and nursing note reviewed.  Constitutional:      General: She is not in acute distress.     Appearance: Normal appearance.  HENT:     Right Ear: Tympanic membrane normal.     Left Ear: Tympanic membrane normal.     Ears:     Comments: EAC clear bilaterally with good view of TM which is without effusion or erythema.     Nose:     Right Sinus: Frontal sinus tenderness present.     Left Sinus: Frontal sinus tenderness  present.     Mouth/Throat:     Mouth: Mucous membranes are moist.     Pharynx: No oropharyngeal exudate or posterior oropharyngeal erythema.  Eyes:     Conjunctiva/sclera: Conjunctivae normal.     Pupils: Pupils are equal, round, and reactive to light.  Cardiovascular:     Rate and Rhythm: Normal rate and regular rhythm.     Heart sounds: No murmur heard.    No friction rub. No gallop.  Pulmonary:     Effort: Pulmonary effort is normal.     Breath sounds: Normal breath sounds. No wheezing, rhonchi or rales.  Lymphadenopathy:     Cervical: Cervical adenopathy present.     Right cervical: Superficial cervical adenopathy present.     Left cervical: Superficial cervical adenopathy present.     Recent Labs     Component Value Date/Time   NA 139 11/19/2022 0909   NA 137 08/07/2013 0541   K 4.5 11/19/2022 0909   K 4.0 08/07/2013 0541   CL 101 11/19/2022 0909   CL 105 08/07/2013 0541   CO2 24 11/19/2022 0909   CO2 29 08/07/2013 0541   GLUCOSE 81 11/19/2022 0909   GLUCOSE 94 08/07/2013 0541   BUN 9 11/19/2022 0909   BUN 8 08/07/2013 0541   CREATININE 0.96 11/19/2022 0909   CREATININE 0.80 08/07/2013 0541   CALCIUM 9.5 11/19/2022 0909   CALCIUM 8.9 08/07/2013 0541   PROT 6.8 11/19/2022 0909   PROT 7.2 08/07/2013 0541   ALBUMIN 4.3 11/19/2022 0909   ALBUMIN 3.5 08/07/2013 0541   AST 25 11/19/2022 0909   AST 24 08/07/2013 0541   ALT 28 11/19/2022 0909   ALT 15 08/07/2013 0541   ALKPHOS 85 11/19/2022 0909   ALKPHOS 52 08/07/2013 0541   BILITOT 0.4 11/19/2022 0909   BILITOT 0.8 08/07/2013 0541   GFRNONAA 84 04/16/2020 0913   GFRNONAA >60 08/07/2013  0541   GFRAA 96 04/16/2020 0913   GFRAA >60 08/07/2013 0541    Lab Results  Component Value Date   WBC 7.0 06/30/2018   HGB 14.3 06/30/2018   HCT 42.1 06/30/2018   MCV 92 06/30/2018   PLT 278 06/30/2018   No results found for: "HGBA1C" Lab Results  Component Value Date   CHOL 198 11/19/2022   HDL 65 11/19/2022   LDLCALC 107 (H) 11/19/2022   TRIG 149 11/19/2022   CHOLHDL 4.8 (H) 06/30/2018   Lab Results  Component Value Date   TSH 2.470 11/19/2022     Assessment and Plan:  1. Acute non-recurrent frontal sinusitis Suspect superimposed bacterial sinusitis on the back end of COVID.  Treat with Augmentin as below. - amoxicillin-clavulanate (AUGMENTIN) 875-125 MG tablet; Take 1 tablet by mouth 2 (two) times daily for 5 days.  Dispense: 10 tablet; Refill: 0   Alvester Morin, PA-C, DMSc, Nutritionist [facility] [phone]

## 2023-01-03 ENCOUNTER — Other Ambulatory Visit: Payer: Self-pay | Admitting: Family Medicine

## 2023-01-03 DIAGNOSIS — Z1231 Encounter for screening mammogram for malignant neoplasm of breast: Secondary | ICD-10-CM

## 2023-01-05 ENCOUNTER — Ambulatory Visit
Admission: RE | Admit: 2023-01-05 | Discharge: 2023-01-05 | Disposition: A | Discharge status: Home / Self Care | Payer: BC Managed Care – PPO | Source: Ambulatory Visit | Attending: Family Medicine | Admitting: Family Medicine

## 2023-01-05 DIAGNOSIS — Z1231 Encounter for screening mammogram for malignant neoplasm of breast: Secondary | ICD-10-CM | POA: Insufficient documentation

## 2023-05-06 ENCOUNTER — Ambulatory Visit: Payer: BC Managed Care – PPO | Admitting: Family Medicine

## 2023-05-06 VITALS — BP 126/84 | HR 76 | Ht 66.5 in | Wt 206.0 lb

## 2023-05-06 DIAGNOSIS — E782 Mixed hyperlipidemia: Secondary | ICD-10-CM

## 2023-05-06 DIAGNOSIS — E034 Atrophy of thyroid (acquired): Secondary | ICD-10-CM

## 2023-05-06 MED ORDER — ATORVASTATIN CALCIUM 10 MG PO TABS: ORAL_TABLET | ORAL | 1 refills | Status: DC

## 2023-05-06 MED ORDER — LEVOTHYROXINE SODIUM 25 MCG PO TABS: ORAL_TABLET | ORAL | 1 refills | Status: DC

## 2023-05-06 NOTE — Progress Notes (Signed)
 Date:  05/06/2023   Name:  Emily Sawyer   DOB:  Oct 10, 1975   MRN:  [id number]   Chief Complaint: Medical Management of Chronic Issues (Patient presents today for a follow up to get her medications refilled. She is doing well today. She does not have any concerns for today's visit. )  Hyperlipidemia This is a chronic problem. The current episode started more than 1 year ago. The problem is controlled. Recent lipid tests were reviewed and are normal. Exacerbating diseases include hypothyroidism. She has no history of chronic renal disease, diabetes, liver disease, obesity or nephrotic syndrome. Pertinent negatives include no chest pain, focal sensory loss, focal weakness, leg pain, myalgias or shortness of breath. Current antihyperlipidemic treatment includes statins. The current treatment provides moderate improvement of lipids. There are no compliance problems.   Thyroid Problem Presents for follow-up visit. Symptoms include dry skin and fatigue. Patient reports no anxiety, cold intolerance, constipation, depressed mood, diaphoresis, diarrhea, hair loss, heat intolerance, leg swelling, nail problem, palpitations, visual change, weight gain or weight loss. Her past medical history is significant for hyperlipidemia. There is no history of diabetes.    Lab Results  Component Value Date   NA 139 11/19/2022   K 4.5 11/19/2022   CO2 24 11/19/2022   GLUCOSE 81 11/19/2022   BUN 9 11/19/2022   CREATININE 0.96 11/19/2022   CALCIUM 9.5 11/19/2022   EGFR 73 11/19/2022   GFRNONAA 84 04/16/2020   Lab Results  Component Value Date   CHOL 198 11/19/2022   HDL 65 11/19/2022   LDLCALC 107 (H) 11/19/2022   TRIG 149 11/19/2022   CHOLHDL 4.8 (H) 06/30/2018   Lab Results  Component Value Date   TSH 2.470 11/19/2022   No results found for: "HGBA1C" Lab Results  Component Value Date   WBC 7.0 06/30/2018   HGB 14.3 06/30/2018   HCT 42.1 06/30/2018   MCV 92 06/30/2018   PLT 278 06/30/2018   Lab  Results  Component Value Date   ALT 28 11/19/2022   AST 25 11/19/2022   ALKPHOS 85 11/19/2022   BILITOT 0.4 11/19/2022   No results found for: "25OHVITD2", "25OHVITD3", "VD25OH"   Review of Systems  Constitutional:  Positive for fatigue. Negative for diaphoresis, unexpected weight change, weight gain and weight loss.  HENT:  Negative for trouble swallowing.   Eyes:  Negative for visual disturbance.  Respiratory:  Negative for chest tightness, shortness of breath and wheezing.   Cardiovascular:  Negative for chest pain, palpitations and leg swelling.  Gastrointestinal:  Negative for constipation and diarrhea.  Endocrine: Negative for cold intolerance, heat intolerance, polydipsia and polyuria.  Musculoskeletal:  Negative for myalgias.  Neurological:  Negative for focal weakness.  Psychiatric/Behavioral:  The patient is not nervous/anxious.     Patient Active Problem List   Diagnosis Date Noted   Screening for colon cancer    Depression 06/04/2021   Thyroid activity decreased 03/01/2018   Status post total hysterectomy 02/10/2017   Dysmenorrhea 09/29/2016   Menorrhagia with regular cycle 09/29/2016   Endometriosis 09/20/2016   Endometrioma of ovary 09/20/2016    Allergies  Allergen Reactions   Cyanoacrylate Hives and Rash   Other Hives    dermabond-hives   Pollen Extract Other (See Comments)    Runny nose   Codeine Other (See Comments) and Nausea And Vomiting   Latex Itching    Past Surgical History:  Procedure Laterality Date   COLONOSCOPY WITH PROPOFOL N/A 11/17/2021   Procedure: COLONOSCOPY  WITH PROPOFOL;  Surgeon: Toney Reil, MD;  Location: [facility];  Service: Gastroenterology;  Laterality: N/A;   GALLBLADDER SURGERY     LAPAROSCOPY     TOTAL ABDOMINAL HYSTERECTOMY      Social History   Tobacco Use   Smoking status: Never   Smokeless tobacco: Never  Vaping Use   Vaping status: Never Used  Substance Use Topics   Alcohol use: Yes     Comment: rare   Drug use: No     Medication list has been reviewed and updated.  Current Meds  Medication Sig   atorvastatin (LIPITOR) 10 MG tablet Take 1 tablet by mouth daily   buPROPion (WELLBUTRIN XL) 300 MG 24 hr tablet Take 1 tablet by mouth daily. [facility] psych   estradiol (ESTRACE) 2 MG tablet Take 1 tablet by mouth daily. Dr Shift/ [facility]   FLUoxetine (PROZAC) 20 MG tablet Take 1 tablet (20 mg total) by mouth daily. (Patient taking differently: Take 20 mg by mouth daily. [facility])   levothyroxine (SYNTHROID) 25 MCG tablet Take 1 tablet by mouth daily   loratadine (CLARITIN) 10 MG tablet Take 10 mg by mouth daily. otc   mupirocin ointment (BACTROBAN) 2 % Apply 1 application topically 2 (two) times daily.   triamcinolone cream (KENALOG) 0.1 % Apply 1 application topically 2 (two) times daily.       05/06/2023    8:22 AM 12/03/2022   11:11 AM 11/19/2022    8:24 AM 05/11/2022    8:26 AM  GAD 7 : Generalized Anxiety Score  Nervous, Anxious, on Edge 2 1 0 0  Control/stop worrying 0 0 0 0  Worry too much - different things 0 0 0 0  Trouble relaxing 2 2 0 0  Restless 2 0 0 0  Easily annoyed or irritable 1 1 0 0  Afraid - awful might happen 0 0 0 0  Total GAD 7 Score 7 4 0 0  Anxiety Difficulty Somewhat difficult Not difficult at all Not difficult at all Not difficult at all       05/06/2023    8:20 AM 12/03/2022   11:11 AM 11/19/2022    8:24 AM  Depression screen PHQ 2/9  Decreased Interest 1 1 0  Down, Depressed, Hopeless 0 0 0  PHQ - 2 Score 1 1 0  Altered sleeping 3 2 0  Tired, decreased energy 1 2 0  Change in appetite 0 1 0  Feeling bad or failure about yourself  0 0 0  Trouble concentrating 1 2 0  Moving slowly or fidgety/restless 0 1 0  Suicidal thoughts 0 0 0  PHQ-9 Score 6 9 0  Difficult doing work/chores Not difficult at all Not difficult at all Not difficult at all    BP Readings from Last 3 Encounters:  05/06/23 126/84  12/03/22 110/80  11/19/22 110/64     Physical Exam Vitals and nursing note reviewed.  Constitutional:      General: She is not in acute distress.    Appearance: She is not diaphoretic.  HENT:     Head: Normocephalic and atraumatic.     Right Ear: External ear normal.     Left Ear: External ear normal.     Nose: Nose normal.  Eyes:     General:        Right eye: No discharge.        Left eye: No discharge.     Conjunctiva/sclera: Conjunctivae normal.  Pupils: Pupils are equal, round, and reactive to light.  Neck:     Thyroid: No thyromegaly.     Vascular: No JVD.  Cardiovascular:     Rate and Rhythm: Normal rate and regular rhythm.     Heart sounds: Normal heart sounds. No murmur heard.    No friction rub. No gallop.  Pulmonary:     Effort: Pulmonary effort is normal.     Breath sounds: Normal breath sounds.  Abdominal:     General: Bowel sounds are normal.     Palpations: Abdomen is soft. There is no mass.     Tenderness: There is no abdominal tenderness. There is no guarding.  Musculoskeletal:        General: Normal range of motion.     Cervical back: Normal range of motion and neck supple.  Lymphadenopathy:     Cervical: No cervical adenopathy.  Skin:    General: Skin is warm and dry.  Neurological:     Deep Tendon Reflexes:     Reflex Scores:      Patellar reflexes are 2+ on the right side and 2+ on the left side.    Wt Readings from Last 3 Encounters:  05/06/23 206 lb (93.4 kg)  12/03/22 213 lb (96.6 kg)  11/19/22 212 lb (96.2 kg)    BP 126/84   Pulse 76   Ht 5' 6.5" (1.689 m)   Wt 206 lb (93.4 kg)   LMP 08/22/2016   SpO2 96%   BMI 32.75 kg/m   Assessment and Plan: 1. Mixed hyperlipidemia Chronic.  Controlled.  Stable.  Asymptomatic without myalgias or muscle weakness.  Continue atorvastatin once a day.  Will check lipid panel for LDL control for hepatic concerns will recheck patient in 6 months. - atorvastatin (LIPITOR) 10 MG tablet; Take 1 tablet by mouth daily  Dispense: 90  tablet; Refill: 1 - Lipid Panel With LDL/HDL Ratio - Comprehensive metabolic panel  2. 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  Controlled.  Stable.  Other than fatigue patient is asymptomatic.  Likely continue levothyroxine 25 mcg daily.  Will recheck patient in 6 months. - levothyroxine (SYNTHROID) 25 MCG tablet; Take 1 tablet by mouth daily  Dispense: 90 tablet; Refill: 1 - TSH     Elizabeth Sauer, MD

## 2023-05-07 ENCOUNTER — Encounter: Payer: Self-pay | Admitting: Family Medicine

## 2023-05-07 LAB — COMPREHENSIVE METABOLIC PANEL
ALT: 22 IU/L (ref 0–32)
AST: 19 IU/L (ref 0–40)
Albumin: 4.4 g/dL (ref 3.9–4.9)
Alkaline Phosphatase: 95 IU/L (ref 44–121)
BUN/Creatinine Ratio: 10 (ref 9–23)
BUN: 10 mg/dL (ref 6–24)
Bilirubin Total: 0.4 mg/dL (ref 0.0–1.2)
CO2: 24 mmol/L (ref 20–29)
Calcium: 9.2 mg/dL (ref 8.7–10.2)
Chloride: 102 mmol/L (ref 96–106)
Creatinine, Ser: 1.02 mg/dL — ABNORMAL HIGH (ref 0.57–1.00)
Globulin, Total: 2.7 g/dL (ref 1.5–4.5)
Glucose: 75 mg/dL (ref 70–99)
Potassium: 4.7 mmol/L (ref 3.5–5.2)
Sodium: 142 mmol/L (ref 134–144)
Total Protein: 7.1 g/dL (ref 6.0–8.5)
eGFR: 68 mL/min/{1.73_m2} (ref >59)

## 2023-05-07 LAB — LIPID PANEL WITH LDL/HDL RATIO
Cholesterol, Total: 119 mg/dL (ref 100–199)
HDL: 58 mg/dL (ref >39)
LDL Chol Calc (NIH): 43 mg/dL (ref 0–99)
LDL/HDL Ratio: 0.7 ratio (ref 0.0–3.2)
Triglycerides: 93 mg/dL (ref 0–149)
VLDL Cholesterol Cal: 18 mg/dL (ref 5–40)

## 2023-05-07 LAB — TSH: TSH: 2.45 u[IU]/mL (ref 0.450–4.500)

## 2023-09-30 ENCOUNTER — Ambulatory Visit: Admission: EM | Admit: 2023-09-30 | Discharge: 2023-09-30 | Disposition: A | Discharge status: Home / Self Care

## 2023-09-30 ENCOUNTER — Other Ambulatory Visit: Payer: Self-pay

## 2023-09-30 DIAGNOSIS — S0990XA Unspecified injury of head, initial encounter: Secondary | ICD-10-CM | POA: Diagnosis not present

## 2023-09-30 DIAGNOSIS — S0101XA Laceration without foreign body of scalp, initial encounter: Secondary | ICD-10-CM | POA: Diagnosis not present

## 2023-09-30 MED ORDER — TETANUS-DIPHTH-ACELL PERTUSSIS 5-2.5-18.5 LF-MCG/0.5 IM SUSY
0.5000 mL | PREFILLED_SYRINGE | Freq: Once | INTRAMUSCULAR | Status: AC
  Administered 2023-09-30: 0.5 mL via INTRAMUSCULAR

## 2023-09-30 NOTE — ED Triage Notes (Signed)
 Pt reports she was helping take down a metal carport and was hit in the head with a beam. She was wearing a baseball cap. Approx 1 inch lac noted on scalp. Bleeding controlled. No LOC.

## 2023-09-30 NOTE — Discharge Instructions (Signed)
  Return in 7-10 days for staple removal.

## 2023-10-02 DIAGNOSIS — S0101XA Laceration without foreign body of scalp, initial encounter: Secondary | ICD-10-CM | POA: Diagnosis not present

## 2023-10-02 NOTE — ED Provider Notes (Signed)
 [facility]    CSN: [id number] Arrival date & time: 09/30/23  1732      History   Chief Complaint Chief Complaint  Patient presents with   Head Injury    HPI Emily Sawyer is a 48 y.o. female.   Patient here today for evaluation of laceration to her right upper scalp that occurred earlier when she was hit in the head with the beam from a carport.  She reports that she was trying to help put up her carport and had the beam approximately 2 to 3 feet above her head when it fell.  She was wearing a ball cap during the injury.  Bleeding has been controlled.  She denies any LOC.  She is not headache.  She denies any nausea or vomiting.  She has not any vision changes.  The history is provided by the patient.  Head Injury Associated symptoms: no headaches, no nausea, no numbness and no vomiting     Past Medical History:  Diagnosis Date   Depression    Endometriosis    Thyroid  disease    T4 low    Patient Active Problem List   Diagnosis Date Noted   Screening for colon cancer    Depression 06/04/2021   Thyroid  activity decreased 03/01/2018   Status post total hysterectomy 02/10/2017   Dysmenorrhea 09/29/2016   Menorrhagia with regular cycle 09/29/2016   Endometriosis 09/20/2016   Endometrioma of ovary 09/20/2016    Past Surgical History:  Procedure Laterality Date   COLONOSCOPY WITH PROPOFOL  N/A 11/17/2021   Procedure: COLONOSCOPY WITH PROPOFOL ;  Surgeon: Unk Corinn Skiff, MD;  Location: [facility] ENDOSCOPY;  Service: Gastroenterology;  Laterality: N/A;   GALLBLADDER SURGERY     LAPAROSCOPY     TOTAL ABDOMINAL HYSTERECTOMY      OB History     Gravida  3   Para      Term      Preterm      AB  3   Living         SAB  3   IAB      Ectopic      Multiple      Live Births               Home Medications    Prior to Admission medications   Medication Sig Start Date End Date Taking? Authorizing Provider  atorvastatin  (LIPITOR) 10 MG  tablet Take 1 tablet by mouth daily 05/06/23  Yes Jones, Deanna C, MD  buPROPion (WELLBUTRIN XL) 300 MG 24 hr tablet Take 1 tablet by mouth daily. [facility] psych 08/16/20 09/30/23 Yes [provider]  estradiol (ESTRACE) 2 MG tablet Take 1 tablet by mouth daily. Dr Shift/ [facility] 04/10/20 09/30/23 Yes [provider]  FLUoxetine  (PROZAC ) 20 MG tablet Take 1 tablet (20 mg total) by mouth daily. Patient taking differently: Take 20 mg by mouth daily. [facility] 04/15/20  Yes Joshua Cathryne BROCKS, MD  levothyroxine  (SYNTHROID ) 25 MCG tablet Take 1 tablet by mouth daily 05/06/23  Yes Jones, Deanna C, MD  loratadine (CLARITIN) 10 MG tablet Take 10 mg by mouth daily. otc   Yes [provider]  methocarbamol (ROBAXIN) 750 MG tablet Take 750 mg by mouth as directed. 01/17/23  Yes [provider]  amoxicillin -clavulanate (AUGMENTIN ) 875-125 MG tablet Take 1 tablet by mouth 2 (two) times daily. Patient not taking: Reported on 09/30/2023    [provider]  buPROPion HBr (APLENZIN PO)  [provider]  etodolac (LODINE) 500 MG tablet Take 500 mg by mouth 2 (two) times daily. Patient not taking: Reported on 09/30/2023 08/11/16   [provider]  gabapentin (NEURONTIN) 100 MG capsule Take 100 mg by mouth at bedtime as needed. Patient not taking: Reported on 09/30/2023    [provider]  lisdexamfetamine (VYVANSE) 10 MG capsule Take 10 mg by mouth daily. 09/26/23   [provider]  mupirocin  ointment (BACTROBAN ) 2 % Apply 1 application topically 2 (two) times daily. 08/22/18   Joshua Cathryne BROCKS, MD  Naproxen (NAPROSYN PO)     [provider]  naproxen (NAPROSYN) 500 MG tablet Take 500 mg by mouth as directed. Patient not taking: Reported on 09/30/2023 01/17/23 01/17/24  [provider]  triamcinolone  cream (KENALOG ) 0.1 % Apply 1 application topically 2 (two) times daily. 05/19/18   Joshua Cathryne BROCKS, MD    Family History Family History  Problem  Relation Age of Onset   Hypertension Mother    Glaucoma Mother    Hypertension Father    Cancer Father    Cancer Paternal Grandmother    Heart disease Paternal Grandfather    Diabetes Brother    Hypertension Brother    Breast cancer Neg Hx     Social History Social History   Tobacco Use   Smoking status: Never   Smokeless tobacco: Never  Vaping Use   Vaping status: Never Used  Substance Use Topics   Alcohol use: Yes    Comment: rare   Drug use: No     Allergies   Codeine, Cyanoacrylate, Wound dressing adhesive, Other, Pollen extract, and Latex   Review of Systems Review of Systems  Constitutional:  Negative for chills and fever.  Eyes:  Negative for discharge, redness and visual disturbance.  Respiratory:  Negative for shortness of breath.   Gastrointestinal:  Negative for abdominal pain, nausea and vomiting.  Skin:  Positive for wound.  Neurological:  Negative for dizziness, light-headedness, numbness and headaches.     Physical Exam Triage Vital Signs ED Triage Vitals  Encounter Vitals Group     BP 09/30/23 1759 124/83     Girls Systolic BP Percentile --      Girls Diastolic BP Percentile --      Boys Systolic BP Percentile --      Boys Diastolic BP Percentile --      Pulse Rate 09/30/23 1759 (!) 107     Resp 09/30/23 1759 18     Temp 09/30/23 1759 98.6 F (37 C)     Temp Source 09/30/23 1759 Oral     SpO2 09/30/23 1759 98 %     Weight --      Height --      Head Circumference --      Peak Flow --      Pain Score 09/30/23 1745 2     Pain Loc --      Pain Education --      Exclude from Growth Chart --    No data found.  Updated Vital Signs BP 124/83 (BP Location: Left Arm)   Pulse (!) 107   Temp 98.6 F (37 C) (Oral)   Resp 18   LMP 08/22/2016   SpO2 98%   Visual Acuity Right Eye Distance:   Left Eye Distance:   Bilateral Distance:    Right Eye Near:   Left Eye Near:    Bilateral Near:     Physical Exam Vitals and  nursing note  reviewed.  Constitutional:      General: She is not in acute distress.    Appearance: Normal appearance. She is not ill-appearing.  HENT:     Head: Normocephalic.     Comments: No instability noted to head around laceration Eyes:     Extraocular Movements: Extraocular movements intact.     Conjunctiva/sclera: Conjunctivae normal.     Pupils: Pupils are equal, round, and reactive to light.  Cardiovascular:     Rate and Rhythm: Normal rate.  Pulmonary:     Effort: Pulmonary effort is normal. No respiratory distress.  Skin:    Comments: See photo of laceration to right lateral scalp  Neurological:     Mental Status: She is alert.  Psychiatric:        Mood and Affect: Mood normal.        Behavior: Behavior normal.        Thought Content: Thought content normal.      UC Treatments / Results  Labs (all labs ordered are listed, but only abnormal results are displayed) Labs Reviewed - No data to display  EKG   Radiology No results found.  Procedures Laceration Repair  Date/Time: 10/02/2023 11:34 AM  Performed by: Billy Asberry FALCON, PA-C Authorized by: Billy Asberry FALCON, PA-C   Consent:    Consent obtained:  Verbal   Consent given by:  Patient   Risks discussed:  Infection and pain   Alternatives discussed:  Delayed treatment Universal protocol:    Procedure explained and questions answered to patient or proxy's satisfaction: yes     Required blood products, implants, devices, and special equipment available: yes     Patient identity confirmed:  Verbally with patient Anesthesia:    Anesthesia method:  None Laceration details:    Location:  Scalp   Scalp location:  R temporal   Length (cm):  3 Exploration:    Imaging outcome: foreign body not noted     Contaminated: no   Treatment:    Area cleansed with:  Saline   Amount of cleaning:  Standard   Irrigation method:  Syringe Skin repair:    Repair method:  Staples   Number of staples:  4 Repair type:    Repair  type:  Simple Post-procedure details:    Dressing:  Open (no dressing)   Procedure completion:  Tolerated well, no immediate complications  (including critical care time)  Medications Ordered in UC Medications  Tdap (BOOSTRIX ) injection 0.5 mL (0.5 mLs Intramuscular Given 09/30/23 1811)    Initial Impression / Assessment and Plan / UC Course  I have reviewed the triage vital signs and the nursing notes.  Pertinent labs & imaging results that were available during my care of the patient were reviewed by me and considered in my medical decision making (see chart for details).    Staples used for successful closure of laceration. Advised to avoid showering hair tonight, ok to do this tomorrow with caution. Discussed signs and symptoms warranting ED evaluation. Recommended she return in 7-10 days for staple removal or sooner with any further concerns. Tetanus vaccination updated in office today.   Final Clinical Impressions(s) / UC Diagnoses   Final diagnoses:  Injury of head, initial encounter  Scalp laceration, initial encounter     Discharge Instructions       Return in 7-10 days for staple removal.     ED Prescriptions   None    PDMP not reviewed this encounter.  Billy Asberry FALCON, PA-C 10/02/23 1136

## 2023-10-10 ENCOUNTER — Ambulatory Visit
Admission: RE | Admit: 2023-10-10 | Discharge: 2023-10-10 | Disposition: A | Discharge status: Home / Self Care | Source: Ambulatory Visit | Attending: Emergency Medicine | Admitting: Emergency Medicine

## 2023-10-10 DIAGNOSIS — Z4802 Encounter for removal of sutures: Secondary | ICD-10-CM | POA: Diagnosis not present

## 2023-10-27 ENCOUNTER — Other Ambulatory Visit: Payer: Self-pay | Admitting: Medical Genetics

## 2023-10-28 ENCOUNTER — Other Ambulatory Visit
Admission: RE | Admit: 2023-10-28 | Discharge: 2023-10-28 | Disposition: A | Discharge status: Home / Self Care | Payer: Self-pay | Source: Ambulatory Visit | Attending: Medical Genetics | Admitting: Medical Genetics

## 2023-11-03 ENCOUNTER — Ambulatory Visit: Admitting: Student

## 2023-11-03 VITALS — BP 114/74 | HR 63 | Ht 66.5 in | Wt 174.2 lb

## 2023-11-03 DIAGNOSIS — Z9109 Other allergy status, other than to drugs and biological substances: Secondary | ICD-10-CM

## 2023-11-03 DIAGNOSIS — E785 Hyperlipidemia, unspecified: Secondary | ICD-10-CM | POA: Insufficient documentation

## 2023-11-03 DIAGNOSIS — Z23 Encounter for immunization: Secondary | ICD-10-CM | POA: Diagnosis not present

## 2023-11-03 DIAGNOSIS — E034 Atrophy of thyroid (acquired): Secondary | ICD-10-CM

## 2023-11-03 DIAGNOSIS — Z1231 Encounter for screening mammogram for malignant neoplasm of breast: Secondary | ICD-10-CM

## 2023-11-03 DIAGNOSIS — E663 Overweight: Secondary | ICD-10-CM | POA: Diagnosis not present

## 2023-11-03 DIAGNOSIS — E7800 Pure hypercholesterolemia, unspecified: Secondary | ICD-10-CM

## 2023-11-03 DIAGNOSIS — N809 Endometriosis, unspecified: Secondary | ICD-10-CM

## 2023-11-03 NOTE — Progress Notes (Signed)
 Established Patient Office Visit  Subjective   Patient ID: Emily Sawyer, female    DOB: 1975/12/30  Age: 48 y.o. MRN: [id number]  Chief Complaint  Patient presents with   Establish Care    Patient is here today to establish care with new pcp    Emily Sawyer with medical hx listed below presents today for transfer of care. Previously seeing Dr. Joshua who recently retired. Feeling well today no acute complaints.   Patient Active Problem List   Diagnosis Date Noted   Overweight 11/03/2023   Environmental allergies 11/03/2023   Hyperlipidemia 11/03/2023   Screening for colon cancer    Depression 06/04/2021   Hypothyroidism 03/01/2018   Status post total hysterectomy 02/10/2017   Endometriosis 09/20/2016      ROS Refer to HPI    Objective:     Outpatient Encounter Medications as of 11/03/2023  Medication Sig   atorvastatin  (LIPITOR) 10 MG tablet Take 1 tablet by mouth daily   buPROPion (WELLBUTRIN XL) 150 MG 24 hr tablet Take 450 mg by mouth.   estradiol (ESTRACE) 2 MG tablet Take 1 tablet by mouth daily. Dr Shift/ [facility]   FLUoxetine  (PROZAC ) 20 MG tablet Take 1 tablet (20 mg total) by mouth daily.   levothyroxine  (SYNTHROID ) 25 MCG tablet Take 1 tablet by mouth daily   loratadine (CLARITIN) 10 MG tablet Take 10 mg by mouth daily. otc   mupirocin  ointment (BACTROBAN ) 2 % Apply 1 application topically 2 (two) times daily.   Semaglutide, 1 MG/DOSE, 2 MG/1.5ML SOPN Inject 1.5 mg into the skin once a week.   triamcinolone  cream (KENALOG ) 0.1 % Apply 1 application topically 2 (two) times daily.   lisdexamfetamine (VYVANSE) 10 MG capsule Take 10 mg by mouth daily. (Patient not taking: Reported on 11/03/2023)   [DISCONTINUED] amoxicillin -clavulanate (AUGMENTIN ) 875-125 MG tablet Take 1 tablet by mouth 2 (two) times daily. (Patient not taking: Reported on 09/30/2023)   [DISCONTINUED] buPROPion (WELLBUTRIN XL) 300 MG 24 hr tablet Take 1 tablet by mouth daily. [facility] psych (Patient not taking:  Reported on 11/03/2023)   [DISCONTINUED] buPROPion HBr (APLENZIN PO)  (Patient not taking: Reported on 11/03/2023)   [DISCONTINUED] etodolac (LODINE) 500 MG tablet Take 500 mg by mouth 2 (two) times daily. (Patient not taking: Reported on 09/30/2023)   [DISCONTINUED] gabapentin (NEURONTIN) 100 MG capsule Take 100 mg by mouth at bedtime as needed. (Patient not taking: Reported on 09/30/2023)   [DISCONTINUED] methocarbamol (ROBAXIN) 750 MG tablet Take 750 mg by mouth as directed. (Patient not taking: Reported on 11/03/2023)   [DISCONTINUED] Naproxen (NAPROSYN PO)    [DISCONTINUED] naproxen (NAPROSYN) 500 MG tablet Take 500 mg by mouth as directed. (Patient not taking: Reported on 09/30/2023)   No facility-administered encounter medications on file as of 11/03/2023.    BP 114/74   Pulse 63   Ht 5' 6.5 (1.689 m)   Wt 174 lb 4 oz (79 kg)   LMP 08/22/2016   SpO2 99%   BMI 27.70 kg/m  BP Readings from Last 3 Encounters:  11/03/23 114/74  09/30/23 124/83  05/06/23 126/84    Physical Exam Constitutional:      Appearance: Normal appearance.  HENT:     Head:     Comments: Well healed linear right scalp laceration, no staples in place Neck:     Comments: No thyromegaly, no masses Cardiovascular:     Rate and Rhythm: Normal rate and regular rhythm.  Pulmonary:     Effort: Pulmonary effort is normal.  Breath sounds: No rhonchi or rales.  Abdominal:     General: Abdomen is flat. Bowel sounds are normal. There is no distension.     Palpations: Abdomen is soft.     Tenderness: There is no abdominal tenderness.  Musculoskeletal:        General: Normal range of motion.     Right lower leg: No edema.     Left lower leg: No edema.  Skin:    General: Skin is warm and dry.     Capillary Refill: Capillary refill takes less than 2 seconds.  Neurological:     General: No focal deficit present.     Mental Status: She is alert and oriented to person, place, and time.  Psychiatric:        Mood and  Affect: Mood normal.        Behavior: Behavior normal.        11/03/2023   10:24 AM 05/06/2023    8:20 AM 12/03/2022   11:11 AM  Depression screen PHQ 2/9  Decreased Interest 1 1 1   Down, Depressed, Hopeless 1 0 0  PHQ - 2 Score 2 1 1   Altered sleeping 1 3 2   Tired, decreased energy 1 1 2   Change in appetite 0 0 1  Feeling bad or failure about yourself  0 0 0  Trouble concentrating 3 1 2   Moving slowly or fidgety/restless 0 0 1  Suicidal thoughts 0 0 0  PHQ-9 Score 7 6 9   Difficult doing work/chores Somewhat difficult Not difficult at all Not difficult at all       11/03/2023   10:24 AM 05/06/2023    8:22 AM 12/03/2022   11:11 AM 11/19/2022    8:24 AM  GAD 7 : Generalized Anxiety Score  Nervous, Anxious, on Edge 1 2 1  0  Control/stop worrying 0 0 0 0  Worry too much - different things 1 0 0 0  Trouble relaxing 2 2 2  0  Restless 1 2 0 0  Easily annoyed or irritable 0 1 1 0  Afraid - awful might happen 0 0 0 0  Total GAD 7 Score 5 7 4  0  Anxiety Difficulty Not difficult at all Somewhat difficult Not difficult at all Not difficult at all    No results found for any visits on 11/03/23.  Last CBC Lab Results  Component Value Date   WBC 7.0 06/30/2018   HGB 14.3 06/30/2018   HCT 42.1 06/30/2018   MCV 92 06/30/2018   MCH 31.2 06/30/2018   RDW 12.6 06/30/2018   PLT 278 06/30/2018   Last metabolic panel Lab Results  Component Value Date   GLUCOSE 75 05/06/2023   NA 142 05/06/2023   K 4.7 05/06/2023   CL 102 05/06/2023   CO2 24 05/06/2023   BUN 10 05/06/2023   CREATININE 1.02 (H) 05/06/2023   EGFR 68 05/06/2023   CALCIUM  9.2 05/06/2023   PHOS 2.7 (L) 05/11/2022   PROT 7.1 05/06/2023   ALBUMIN 4.4 05/06/2023   LABGLOB 2.7 05/06/2023   AGRATIO 1.6 10/16/2021   BILITOT 0.4 05/06/2023   ALKPHOS 95 05/06/2023   AST 19 05/06/2023   ALT 22 05/06/2023   ANIONGAP 3 (L) 08/07/2013   Lipid Panel     Component Value Date/Time   CHOL 119 05/06/2023 0910   TRIG 93  05/06/2023 0910   HDL 58 05/06/2023 0910   CHOLHDL 4.8 (H) 06/30/2018 0850   LDLCALC 43 05/06/2023 0910   LABVLDL  18 05/06/2023 0910      Assessment & Plan:  Overweight Assessment & Plan: Weigh is 174 lbs from 206 lbs at last OV. Is using compounded semaglutide from Ro for about 6 months. Estimated about 30 pound weight loss on this. Exercises doing yardwork, walking, and strength training a couple times a week. Continue lifestyle modifications.    Encounter for immunization -     Flu vaccine trivalent PF, 6mos and older(Flulaval,Afluria,Fluarix,Fluzone)  Hypothyroidism due to acquired atrophy of thyroid  Assessment & Plan: TSH 2.4 in March well controlled on levothyroxine  25 mcg daily. Denies cold/heat intolerance, hair loss, dry skin, constipation, edema, or weight pain. Continue levothyroxine  25 mcg daily   Environmental allergies Assessment & Plan: Allergies to pollen and rag weed. Well controlled on Claritin daily    Screening mammogram for breast cancer -     3D Screening Mammogram, Left and Right  Endometriosis Assessment & Plan: S/p total hysterectomy on estradiol with surgeon.    Pure hypercholesterolemia Assessment & Plan: Currently on atorvastatin  10 mg daily. Has made dietary changes with improvement in LDL level on March. No muscles aches or other adverse reaction on statin. Continue current medication. Lipid panel at next visit.       Return in about 6 months (around 05/02/2024) for physical.    Harlene Saddler, MD

## 2023-11-03 NOTE — Assessment & Plan Note (Addendum)
 Weigh is 174 lbs from 206 lbs at last OV. Is using compounded semaglutide from Ro for about 6 months. Estimated about 30 pound weight loss on this. Exercises doing yardwork, walking, and strength training a couple times a week. Continue lifestyle modifications.

## 2023-11-03 NOTE — Patient Instructions (Signed)
 Please call to schedule your mammogram [phone]

## 2023-11-03 NOTE — Assessment & Plan Note (Signed)
 S/p total hysterectomy on estradiol with surgeon.

## 2023-11-03 NOTE — Assessment & Plan Note (Addendum)
 TSH 2.4 in March well controlled on levothyroxine  25 mcg daily. Denies cold/heat intolerance, hair loss, dry skin, constipation, edema, or weight pain. Continue levothyroxine  25 mcg daily

## 2023-11-03 NOTE — Assessment & Plan Note (Signed)
 Currently on atorvastatin  10 mg daily. Has made dietary changes with improvement in LDL level on March. No muscles aches or other adverse reaction on statin. Continue current medication. Lipid panel at next visit.

## 2023-11-03 NOTE — Assessment & Plan Note (Signed)
 Allergies to pollen and rag weed. Well controlled on Claritin daily

## 2023-11-07 ENCOUNTER — Ambulatory Visit: Admitting: Student

## 2023-11-08 LAB — GENECONNECT MOLECULAR SCREEN: Genetic Analysis Overall Interpretation: NEGATIVE

## 2023-11-24 ENCOUNTER — Ambulatory Visit: Payer: BC Managed Care – PPO | Admitting: Dermatology

## 2023-11-24 ENCOUNTER — Ambulatory Visit

## 2023-11-24 DIAGNOSIS — D239 Other benign neoplasm of skin, unspecified: Secondary | ICD-10-CM

## 2023-11-24 DIAGNOSIS — L811 Chloasma: Secondary | ICD-10-CM | POA: Diagnosis not present

## 2023-11-24 DIAGNOSIS — L300 Nummular dermatitis: Secondary | ICD-10-CM

## 2023-11-24 MED ORDER — TRETINOIN 0.025 % EX CREA: TOPICAL_CREAM | Freq: Every day | CUTANEOUS | 3 refills | Status: AC

## 2023-11-24 MED ORDER — TRIAMCINOLONE ACETONIDE 0.1 % EX CREA: TOPICAL_CREAM | CUTANEOUS | 2 refills | Status: AC

## 2023-11-24 NOTE — Patient Instructions (Addendum)
 Recommended sunscreens for face for patients with melasma: - La Roche-Posay Anthelios Mineral SPF 50 Sunscreen Tinted - SkinCeuticals Physical Fusion UV Defense Tinted Mineral - EltaMD UV Physical Tinted Facial Suncreen SPF 41 - [place] High Protection Tinted Compact SPF 50 -- this comes in darker tints - Eucerin tinted   Eucerin Radiant Tone     Due to recent changes in healthcare laws, you may see results of your pathology and/or laboratory studies on MyChart before the doctors have had a chance to review them. We understand that in some cases there may be results that are confusing or concerning to you. Please understand that not all results are received at the same time and often the doctors may need to interpret multiple results in order to provide you with the best plan of care or course of treatment. Therefore, we ask that you please give us  2 business days to thoroughly review all your results before contacting the office for clarification. Should we see a critical lab result, you will be contacted sooner.   If You Need Anything After Your Visit  If you have any questions or concerns for your doctor, please call our main line at [phone] and press option 4 to reach your doctor's medical assistant. If no one answers, please leave a voicemail as directed and we will return your call as soon as possible. Messages left after 4 pm will be answered the following business day.   You may also send us  a message via MyChart. We typically respond to MyChart messages within 1-2 business days.  For prescription refills, please ask your pharmacy to contact our office. Our fax number is [phone].  If you have an urgent issue when the clinic is closed that cannot wait until the next business day, you can page your doctor at the number below.    Please note that while we do our best to be available for urgent issues outside of office hours, we are not available 24/7.   If you have  an urgent issue and are unable to reach us , you may choose to seek medical care at your doctor's office, retail clinic, urgent care center, or emergency room.  If you have a medical emergency, please immediately call 911 or go to the emergency department.  Pager Numbers  - Dr. Hester: [phone]  - Dr. Jackquline: [phone]  - Dr. Claudene: [phone]   - Dr. Raymund: [phone]  In the event of inclement weather, please call our main line at [phone] for an update on the status of any delays or closures.  Dermatology Medication Tips: Please keep the boxes that topical medications come in in order to help keep track of the instructions about where and how to use these. Pharmacies typically print the medication instructions only on the boxes and not directly on the medication tubes.   If your medication is too expensive, please contact our office at [phone] option 4 or send us  a message through MyChart.   We are unable to tell what your co-pay for medications will be in advance as this is different depending on your insurance coverage. However, we may be able to find a substitute medication at lower cost or fill out paperwork to get insurance to cover a needed medication.   If a prior authorization is required to get your medication covered by your insurance company, please allow us  1-2 business days to complete this process.  Drug prices often vary depending on where the prescription is filled  and some pharmacies may offer cheaper prices.  The website www.goodrx.com contains coupons for medications through different pharmacies. The prices here do not account for what the cost may be with help from insurance (it may be cheaper with your insurance), but the website can give you the price if you did not use any insurance.  - You can print the associated coupon and take it with your prescription to the pharmacy.  - You may also stop by our office during regular business hours and  pick up a GoodRx coupon card.  - If you need your prescription sent electronically to a different pharmacy, notify our office through [facility] or by phone at [phone] option 4.     Si Usted Necesita Algo Despus de Su Visita  Tambin puede enviarnos un mensaje a travs de Clinical cytogeneticist. Por lo general respondemos a los mensajes de MyChart en el transcurso de 1 a 2 das hbiles.  Para renovar recetas, por favor pida a su farmacia que se ponga en contacto con nuestra oficina. Randi lakes de fax es [place] [phone].  Si tiene un asunto urgente cuando la clnica est cerrada y que no puede esperar hasta el siguiente da hbil, puede llamar/localizar a su doctor(a) al nmero que aparece a continuacin.   Por favor, tenga en cuenta que aunque hacemos todo lo posible para estar disponibles para asuntos urgentes fuera del horario de [place], no estamos disponibles las 24 horas del da, los 7 [address]  [address] de la [place].   Si tiene un problema urgente y no puede comunicarse con nosotros, puede optar por buscar atencin mdica  en el consultorio de su doctor(a), en una clnica privada, en un centro de atencin urgente o en una sala de emergencias.  Si tiene Engineer, drilling, por favor llame inmediatamente al 911 o vaya a la sala de emergencias.  Nmeros de bper  - Dr. Hester: [phone]  - Dra. Jackquline: [phone]  - Dr. Claudene: [phone]  - Dra. Kitts: [phone]  En caso de inclemencias del [place], por favor llame a nuestra lnea principal al [phone] para una actualizacin sobre el estado de cualquier retraso o cierre.  Consejos para la medicacin en dermatologa: Por favor, guarde las cajas en las que vienen los medicamentos de uso tpico para ayudarle a seguir las instrucciones sobre dnde y cmo usarlos. Las farmacias generalmente imprimen las instrucciones del medicamento slo en las cajas y no directamente en los tubos del [place].   Si su medicamento es muy  caro, por favor, pngase en contacto con landry rieger llamando al [phone] y presione la opcin 4 o envenos un mensaje a travs de Clinical cytogeneticist.   No podemos decirle cul ser su copago por los medicamentos por adelantado ya que esto es diferente dependiendo de la cobertura de su seguro. Sin embargo, es posible que podamos encontrar un medicamento sustituto a Audiological scientist un formulario para que el seguro cubra el medicamento que se considera necesario.   Si se requiere una autorizacin previa para que su compaa de seguros malta su medicamento, por favor permtanos de 1 a 2 das hbiles para completar este proceso.  Los precios de los medicamentos varan con frecuencia dependiendo del Environmental consultant de dnde se surte la receta y alguna farmacias pueden ofrecer precios ms baratos.  El sitio web www.goodrx.com tiene cupones para medicamentos de Health and safety inspector. Los precios aqu no tienen en cuenta lo que podra costar con la ayuda del seguro (puede ser ms barato con su seguro), [employer]  sitio web puede darle el precio si no Visual merchandiser.  - Puede imprimir el cupn correspondiente y llevarlo con su receta a la farmacia.  - Tambin puede pasar por nuestra oficina durante el horario de atencin regular y Education officer, museum una tarjeta de cupones de GoodRx.  - Si necesita que su receta se enve electrnicamente a una farmacia diferente, informe a nuestra oficina a travs de MyChart de [place] o por telfono llamando al [phone] y presione la opcin 4.

## 2023-11-24 NOTE — Progress Notes (Signed)
 Subjective   Emily Sawyer is a 48 y.o. female who presents for the following: darkening of skin above upper lip. Patient is new patient  Today patient reports: Patient reports darkened skin above upper lip and patch on right ankle, seems to get worst with sun exposure  On estrogen therapy  Also reports a history of bump at left inner thigh she would like checked.   Rash on lower extremities, using topical abx without relief.   Review of Systems:    No other skin or systemic complaints except as noted in HPI or Assessment and Plan.  The following portions of the chart were reviewed this encounter and updated as appropriate: medications, allergies, medical history  Relevant Medical History:  Reviewed   Objective  Well appearing patient in no apparent distress; mood and affect are within normal limits. Examination was performed of the: Focused Exam of: face, right ankle left inner thigh    Examination notable for: Melasma: Hyperpigmented macules and patches with coalescence located symmetrically on the cheeks and  face   Scaly pink plaques lower extremities - Dermatofibroma(s): Firm dermal nodule(s) with a hyperpigmented halo and a positive dimple sign located on the L thigh  Examination limited by: Clothing and Patient deferred removal       Assessment & Plan   Melasma Chronic and persistent condition with duration or expected duration over one year. Condition is symptomatic and bothersome to patient. Patient is flaring and not currently at treatment goal.  - explained the etiology of the disorder, its difficulty to treat, and chronic nature - Strict sun protection with sun avoidance and tinted broad-spectrum sunscreen (with visible light protection), ideally SPF 50+, but at least 30+ (provided handout with recommendations). Warned that even 10 minutes of unprotected sun exposure while clear can flare melasma - Counseled that this is a chronic condition that will likely  resolve in later years of life - Counseled on goals of treatment - primarily prevention of flares and minimizing pigmentation, not necessarily complete clearance - recommended diligent sun protection - will start compounded topical medication (Hydroquinone 8%, Tretinoin  0.025%, Kojic Acid 1%, Niacinamide 4%, Fluocinolone 0.025% Cream) from [employer]  - advised hydroquinone should not be used for extended use  - Recommended Eucerin thiamidol  - Recommend tinted spf moisturizer cream daily like Eucerin and Elta MD  Nummular dermatitis of lower extremities  Reviewed benign but chronic nature of disease. Stressed importance of breaking the itch-scratch cycle Discussed dry skin care at length, recommended avoidance of fragrances, short showers with luke- warm water, no scrubbing, an unscented moisturizing soap (e.g. Dove sensitive skin) limited to the groin and axillae, and frequent emollient use (Eucerin, Aquaphor, Cerave, Vanicream, Vaseline). start triamcinolone  ointment 0.1% twice daily to affected areas of skin Discussed side effect of potent topical steroids including atrophy, dyspigmentation, striae, telangectasia, folliculitis, loss of skin pigment, hair growth, tachyphylaxis, risk of systemic absorption with missuse.  Dermatofibroma - Reassured the patient that this is a benign scar like reaction that can occur spontaneously or after trauma.  - Lesions are asymptomatic, therefore no treatment is required.  - Instructed to let us  know if lesions become painful or bothersome.   Procedures, orders, diagnosis for this visit:    There are no diagnoses linked to this encounter.  Return to clinic: No follow-ups on file.  Documentation: I have reviewed the above documentation for accuracy and completeness, and I agree with the above.  I, Eleanor Blush, CMA, am acting as scribe for Armon  JAYSON Kanaris, MD.  Lauraine JAYSON Kanaris, MD

## 2023-12-05 ENCOUNTER — Encounter: Payer: Self-pay | Admitting: Student

## 2023-12-15 ENCOUNTER — Encounter: Payer: Self-pay | Admitting: Student

## 2023-12-16 ENCOUNTER — Other Ambulatory Visit: Payer: Self-pay

## 2023-12-16 DIAGNOSIS — E782 Mixed hyperlipidemia: Secondary | ICD-10-CM

## 2023-12-16 MED ORDER — ATORVASTATIN CALCIUM 10 MG PO TABS: ORAL_TABLET | ORAL | 1 refills | Status: AC

## 2023-12-20 ENCOUNTER — Other Ambulatory Visit: Payer: Self-pay | Admitting: Student

## 2023-12-20 DIAGNOSIS — E034 Atrophy of thyroid (acquired): Secondary | ICD-10-CM

## 2023-12-20 MED ORDER — LEVOTHYROXINE SODIUM 25 MCG PO TABS
ORAL_TABLET | ORAL | 1 refills | Status: AC
Start: 2023-12-20 — End: ?

## 2023-12-20 NOTE — Telephone Encounter (Signed)
 Please review message

## 2024-01-10 ENCOUNTER — Ambulatory Visit
Admission: RE | Admit: 2024-01-10 | Discharge: 2024-01-10 | Disposition: A | Discharge status: Home / Self Care | Source: Ambulatory Visit | Attending: Student | Admitting: Student

## 2024-01-10 DIAGNOSIS — Z1231 Encounter for screening mammogram for malignant neoplasm of breast: Secondary | ICD-10-CM | POA: Diagnosis present

## 2024-01-23 NOTE — Progress Notes (Signed)
 [facility]  Established Patient E&M Service    Name: Emily Sawyer Date: 01/23/2024 MRN: [id number] DOB: [birth year] PCP: Winifred Dancer    The patient reports they are physically located in [place]  and is currently: at home. I conducted a audio/video visit. I spent  57m 19s on the video call with the patient. I spent an additional 12 minutes on pre- and post-visit activities on the date of service .     Assessment: Emily Sawyer is a 48 y.o., Other Race White or Caucasian race, Not Hispanic or Latino ethnicity,  ENGLISH speaking female  with a history of mood and cognitive symptoms in the setting of hysterectomy/oopherectomy who presented for evaluation. Reviewed impression for hormonally related mood and cognitive sx, as well as history of this given association with menstrual cycle and prior miscarriages and trauma exposure raising the risk for these sx as well.   Returns for follow-up and overall affective sx and sleep are stable and some improvement in brain fog and attention with vyvanse, will continue to gently optimize   Risk Assessment: A suicide and violence risk assessment was performed as part of this evaluation. There patient is deemed to be at chronic elevated risk for self-harm/suicide given the following factors: N/A. There patient is deemed to be at chronic elevated risk for violence given the following factors: N/A. These risk factors are mitigated by the following factors:lack of active SI/HI, supportive family and sense of responsibility to family and social supports. There is no acute risk for suicide or violence at this time. The patient was educated about relevant modifiable risk factors including following recommendations for treatment of psychiatric illness and abstaining from substance abuse.  While future psychiatric events cannot be accurately predicted, the patient does not currently require  acute inpatient psychiatric care and does not  currently meet [facility]  involuntary commitment criteria.      Diagnoses:  1. 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 Menopausal related brain fog   Stressors: work, family related stressors   Disability Assessment Scale: no impairment    Plan: 1. Continue wellbutrin XL 300 mg daily  2. Continue fluoxetine  40 mg daily 3. Increase vyvanse to 20 mg, will use 10 mg tabs and if tolerates will adjust prescription 4. Completed CBT-I 5. Return in 8 weeks mychart check-in 2-3 weeks  Patient was given this writer's contact information  as well as the [facility] urgent line number.  She was instructed to call [phone] for emergencies.   Gwynneth Doyal Lot, MD   Subjective:  Presents for follow-up. Describes the vyvanse has been helpful, she is able to focus more and has more clarity. Mood has been ok since reducing the wellbutrin with less irritability. Sleeping ok. Interest is intact. Some occasional inattention and fatigue.   Medications/Allergies: reviewed  Medical History/Surgical History/Social history:reviewed  Medication(s) on Presentation:  Outpatient Medications Prior to Visit  Medication Sig Dispense Refill   atorvastatin  (LIPITOR) 10 MG tablet Take 1 tablet (10 mg total) by mouth daily.     buPROPion (WELLBUTRIN XL) 300 MG 24 hr tablet Take 1 tablet (300 mg total) by mouth daily. 90 tablet 1   estradiol (ESTRACE) 2 MG tablet Take 1 tablet (2 mg total) by mouth daily. 90 tablet 3   estradiol (VAGIFEM) 10 mcg vaginal tablet Insert 1 tablet (0.01 mg total) into the vagina Two (2) times a week. 24 tablet 3   FLUoxetine  (PROZAC ) 40 MG capsule Take 1  capsule (40 mg total) by mouth daily. 90 capsule 1   levothyroxine  (SYNTHROID ) 25 MCG tablet Take 1 tablet (25 mcg total) by mouth daily.     lisdexamfetamine (VYVANSE) 10 mg capsule Take 1 capsule (10 mg total) by mouth daily. On work days 25 capsule 0   NON FORMULARY Transdermal testosterone  cream 1.2mg /ml  Apply .25ml or ( ) daily Dispense 22g 1 each 2   No facility-administered medications prior to visit.    ROS: Review of Systems  All other systems reviewed and are negative. As noted in the subjective, some inattention  Objective:  Vitals:  There were no vitals filed for this visit.  Mental Status Exam: Appearance:     Appears stated age, Well nourished and good eye contact   Motor:    No abnormal movements  Speech/Language:     Normal rate, volume, tone, fluency  Mood:    fine  Affect:    full, reactive  Thought process:    Logical, linear, clear, coherent, goal directed  Thought content:      Denies SI, HI, self harm, delusions, obsessions, paranoid ideation, or ideas of reference, minimal FFA   Perceptual disturbances:      Denies auditory and visual hallucinations, behavior not concerning for response to internal stimuli    Orientation:    Oriented to person, place, time, and general circumstances  Attention:    Able to fully attend without fluctuations in consciousness  Concentration:    Able to fully concentrate and attend  Memory:    Immediate, short-term, long-term, and recall grossly intact   Fund of knowledge:     Consistent with level of education and development  Insight:      Intact  Judgment:     Intact  Impulse Control:    Intact     PE: Physical Exam  Calm, no acute distress Normal rate of breathing Moving all extremities   Test Results: Data Review:  TSH 2.03 in care everywhere (04/16/20)  Imaging: Radiology report(s) reviewed.  Psychometrics:  PHQ-9 PHQ-9 Total Score  07/06/2022  9:00 PM 8 *    * Data saved with a previous flowsheet row definition      I spent over 50% of 10 minutes counseling the patient on the management options of hormonally related mood and cognitive sx   Gwynneth Doyal Lot, MD    *Some images could not be shown.

## 2024-04-03 ENCOUNTER — Encounter: Payer: Self-pay | Admitting: Student

## 2024-04-03 NOTE — Telephone Encounter (Signed)
 Please review and advise patient.   JM

## 2024-05-04 ENCOUNTER — Encounter: Admitting: Student

## 2024-05-23 ENCOUNTER — Ambulatory Visit
